# Patient Record
Sex: Male | Born: 1990 | Race: White | Hispanic: No | Marital: Married | Smoking: Current every day smoker
Health system: Southern US, Community
[De-identification: ages and names within clinical notes are randomized; demographics above are authoritative.]

## PROBLEM LIST (undated history)

## (undated) DIAGNOSIS — F419 Anxiety disorder, unspecified: Secondary | ICD-10-CM

## (undated) DIAGNOSIS — N200 Calculus of kidney: Secondary | ICD-10-CM

## (undated) DIAGNOSIS — N201 Calculus of ureter: Secondary | ICD-10-CM

## (undated) DIAGNOSIS — R001 Bradycardia, unspecified: Secondary | ICD-10-CM

## (undated) DIAGNOSIS — F32A Depression, unspecified: Secondary | ICD-10-CM

## (undated) DIAGNOSIS — F329 Major depressive disorder, single episode, unspecified: Secondary | ICD-10-CM

## (undated) HISTORY — DX: Anxiety disorder, unspecified: F41.9

## (undated) HISTORY — DX: Depression, unspecified: F32.A

## (undated) HISTORY — DX: Bradycardia, unspecified: R00.1

## (undated) HISTORY — DX: Major depressive disorder, single episode, unspecified: F32.9

## (undated) HISTORY — DX: Calculus of ureter: N20.1

## (undated) HISTORY — PX: NO PAST SURGERIES: SHX2092

---
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
## 2010-01-25 ENCOUNTER — Ambulatory Visit: Payer: Self-pay | Admitting: Internal Medicine

## 2010-08-02 ENCOUNTER — Ambulatory Visit: Payer: Self-pay | Admitting: Internal Medicine

## 2010-10-26 NOTE — Assessment & Plan Note (Signed)
Summary: nausea/diarrhea/cd   Vital Signs:  Patient profile:   20 year old male Height:      68 inches Weight:      256.25 pounds BMI:     39.10 O2 Sat:      97 % on Room air Temp:     98.4 degrees F oral Pulse rate:   60 / minute BP sitting:   100 / 68  (left arm) Cuff size:   large  Vitals Entered By: Zella Ball Ewing CMA Duncan Dull) (August 02, 2010 3:56 PM)  O2 Flow:  Room air CC: nasal congestion/RE   CC:  nasal congestion/RE.  History of Present Illness: here wtih acute c/o mild to mod 3 days onset facial pain, pressure, fever and greenish d/c, with marked cough, and today onset prod cough as well;  Pt denies CP, worsening sob, doe, wheezing, orthopnea, pnd, worsening LE edema, palps, dizziness or syncope  Pt denies new neuro symptoms such as headache, facial or extremity weakness  Ran out of flonase, which worked well for his nasal symptoms that have flared with the fall season.  No wheezing.  Denies polydipsia, polyuria. Denies worsening depressive symptoms, suicidal ideation, or panic.    Preventive Screening-Counseling & Management      Drug Use:  no.    Problems Prior to Update: 1)  Bronchitis-acute  (ICD-466.0) 2)  Conjunctivitis, Allergic  (ICD-372.14) 3)  Allergic Rhinitis  (ICD-477.9) 4)  Sinusitis- Acute-nos  (ICD-461.9) 5)  Preventive Health Care  (ICD-V70.0)  Medications Prior to Update: 1)  Cetirizine Hcl 10 Mg Tabs (Cetirizine Hcl) .Marland Kitchen.. 1 By Mouth Once Daily As Needed 2)  Fluticasone Propionate 50 Mcg/act Susp (Fluticasone Propionate) .... 2 Spray/side Once Daily As Needed  Current Medications (verified): 1)  Fluticasone Propionate 50 Mcg/act Susp (Fluticasone Propionate) .... 2 Spray/side Once Daily As Needed 2)  Clarithromycin 500 Mg Tabs (Clarithromycin) .Marland Kitchen.. 1po Two Times A Day 3)  Tessalon Perles 100 Mg Caps (Benzonatate) .Marland Kitchen.. 1-2 By Mouth Three Times A Day As Needed  Allergies (verified): No Known Drug Allergies  Past History:  Past Surgical  History: Last updated: 11/27/2008 Denies surgical history  Social History: Last updated: 08/02/2010 Single HS grad no children Never Smoked Alcohol use-no Drug use-no  Risk Factors: Smoking Status: never (11/27/2008)  Past Medical History: Allergic rhinitis  Social History: Single HS grad no children Never Smoked Alcohol use-no Drug use-no Drug Use:  no  Review of Systems       all otherwise negative per pt -    Physical Exam  General:  alert and overweight-appearing.  , mild ill  Head:  normocephalic and atraumatic.   Eyes:  vision grossly intact, pupils equal, and pupils round.   Ears:  bialt tm's mild erythema, canals clear, sinus tender bialt Nose:  nasal dischargemucosal pallor and mucosal edema.   Mouth:  pharyngeal erythema and fair dentition.   Neck:  supple and cervical lymphadenopathy.   Lungs:  normal respiratory effort and normal breath sounds.   Heart:  normal rate and regular rhythm.   Extremities:  no edema, no erythema    Impression & Recommendations:  Problem # 1:  SINUSITIS- ACUTE-NOS (ICD-461.9)  His updated medication list for this problem includes:    Fluticasone Propionate 50 Mcg/act Susp (Fluticasone propionate) .Marland Kitchen... 2 spray/side once daily as needed    Clarithromycin 500 Mg Tabs (Clarithromycin) .Marland Kitchen... 1po two times a day    Tessalon Perles 100 Mg Caps (Benzonatate) .Marland Kitchen... 1-2 by mouth three times  a day as needed treat as above, f/u any worsening signs or symptoms   Problem # 2:  BRONCHITIS-ACUTE (ICD-466.0)  His updated medication list for this problem includes:    Clarithromycin 500 Mg Tabs (Clarithromycin) .Marland Kitchen... 1po two times a day    Tessalon Perles 100 Mg Caps (Benzonatate) .Marland Kitchen... 1-2 by mouth three times a day as needed /treat as above, f/u any worsening signs or symptoms   Problem # 3:  ALLERGIC RHINITIS (ICD-477.9)  The following medications were removed from the medication list:    Cetirizine Hcl 10 Mg Tabs (Cetirizine  hcl) .Marland Kitchen... 1 by mouth once daily as needed His updated medication list for this problem includes:    Fluticasone Propionate 50 Mcg/act Susp (Fluticasone propionate) .Marland Kitchen... 2 spray/side once daily as needed treat as above, f/u any worsening signs or symptoms - for med refill today  Complete Medication List: 1)  Fluticasone Propionate 50 Mcg/act Susp (Fluticasone propionate) .... 2 spray/side once daily as needed 2)  Clarithromycin 500 Mg Tabs (Clarithromycin) .Marland Kitchen.. 1po two times a day 3)  Tessalon Perles 100 Mg Caps (Benzonatate) .Marland Kitchen.. 1-2 by mouth three times a day as needed  Patient Instructions: 1)  Please take all new medications as prescribed 2)  Continue all previous medications as before this visit 3)  You can also use Mucinex OTC or it's generic for congestion  4)  Please schedule a follow-up appointment as needed. Prescriptions: TESSALON PERLES 100 MG CAPS (BENZONATATE) 1-2 by mouth three times a day as needed  #60 x 1   Entered and Authorized by:   Corwin Levins MD   Signed by:   Corwin Levins MD on 08/02/2010   Method used:   Print then Give to Patient   RxID:   [id number] CLARITHROMYCIN 500 MG TABS (CLARITHROMYCIN) 1po two times a day  #20 x 0   Entered and Authorized by:   Corwin Levins MD   Signed by:   Corwin Levins MD on 08/02/2010   Method used:   Print then Give to Patient   RxID:   [id number] FLUTICASONE PROPIONATE 50 MCG/ACT SUSP (FLUTICASONE PROPIONATE) 2 spray/side once daily as needed  #1 x 11   Entered and Authorized by:   Corwin Levins MD   Signed by:   Corwin Levins MD on 08/02/2010   Method used:   Print then Give to Patient   RxID:   [id number]    Orders Added: 1)  Est. Patient Level IV [25366]

## 2010-10-26 NOTE — Assessment & Plan Note (Signed)
Summary: ALLERGIES--STC   Vital Signs:  Patient profile:   20 year old male Height:      68.5 inches Weight:      247.25 pounds BMI:     37.18 O2 Sat:      97 % on Room air Temp:     98.2 degrees F oral Pulse rate:   64 / minute BP sitting:   108 / 62  (left arm) Cuff size:   large  Vitals Entered ByZella Ball Ewing (Jan 25, 2010 3:55 PM)  O2 Flow:  Room air CC: Allergies, congestion, watery eyes/RE   CC:  Allergies, congestion, and watery eyes/RE.  History of Present Illness: here with 2 to 3 wks marked allergic symtpoms, eye and nasal/sinus congestion with itch and sneezing, but no headache, fever, ear symptoms, ST, tongue swelling, and Pt denies CP, sob, doe, wheezing, orthopnea, pnd, worsening LE edema, palps, dizziness or syncope Seems to be similar as symtpoms a year ago when he received a depomedrol shot and seemd to do quite nicely afterward with apparetnly seasonal symtpoms.  This year has not improved at all with the zyrtec otc and has significant congestion.  Problems Prior to Update: 1)  Conjunctivitis, Allergic  (ICD-372.14) 2)  Allergic Rhinitis  (ICD-477.9) 3)  Sinusitis- Acute-nos  (ICD-461.9) 4)  Preventive Health Care  (ICD-V70.0)  Medications Prior to Update: 1)  Cetirizine Hcl 10 Mg Tabs (Cetirizine Hcl) .Marland Kitchen.. 1 By Mouth Once Daily As Needed 2)  Fluticasone Propionate 50 Mcg/act Susp (Fluticasone Propionate) .... 2 Spray/side Once Daily As Needed  Current Medications (verified): 1)  Cetirizine Hcl 10 Mg Tabs (Cetirizine Hcl) .Marland Kitchen.. 1 By Mouth Once Daily As Needed 2)  Fluticasone Propionate 50 Mcg/act Susp (Fluticasone Propionate) .... 2 Spray/side Once Daily As Needed  Allergies (verified): No Known Drug Allergies  Past History:  Past Medical History: Last updated: 01/29/2009 Unremarkable Allergic rhinitis  Past Surgical History: Last updated: 11/27/2008 Denies surgical history  Social History: Last updated: 01/25/2010 Single HS grad no  children Never Smoked Alcohol use-no  Risk Factors: Smoking Status: never (11/27/2008)  Social History: Reviewed history from 11/27/2008 and no changes required. Single HS grad no children Never Smoked Alcohol use-no  Review of Systems       all otherwise negative per pt -    Physical Exam  General:  alert and overweight-appearing.   Head:  normocephalic and atraumatic.   Eyes:  vision grossly intact, pupils equal, and pupils round.   Ears:  bilat conjunctival mild erythema and clear d/c Nose:  nasal dischargemucosal pallor and mucosal edema.   Mouth:  pharyngeal erythema and fair dentition.   Neck:  supple and no masses.   Lungs:  normal respiratory effort and normal breath sounds.   Heart:  normal rate and regular rhythm.   Extremities:  no edema, no erythema    Impression & Recommendations:  Problem # 1:  ALLERGIC RHINITIS (ICD-477.9)  His updated medication list for this problem includes:    Cetirizine Hcl 10 Mg Tabs (Cetirizine hcl) .Marland Kitchen... 1 by mouth once daily as needed    Fluticasone Propionate 50 Mcg/act Susp (Fluticasone propionate) .Marland Kitchen... 2 spray/side once daily as needed  Orders: Depo- Medrol 40mg  (J1030) Depo- Medrol 80mg  (J1040) Admin of Therapeutic Inj  intramuscular or subcutaneous (04540) treat as above, f/u any worsening signs or symptoms.  for depo shot as above, Continue all previous medications as before this visit , and add the flonase nasal   Problem # 2:  CONJUNCTIVITIS, ALLERGIC (ICD-372.14) treat as above, f/u any worsening signs or symptoms , also for OTC zaditor as needed   Complete Medication List: 1)  Cetirizine Hcl 10 Mg Tabs (Cetirizine hcl) .Marland Kitchen.. 1 by mouth once daily as needed 2)  Fluticasone Propionate 50 Mcg/act Susp (Fluticasone propionate) .... 2 spray/side once daily as needed  Patient Instructions: 1)  you had the steroid shot today 2)  Continue all previous medications as before this visit - the zyrtec 3)  you should also  take the naxonex nasal spray at 2 spray/side once daily until the sample runs out 4)  if you need to, you are also given the generic flonase prescription nasal spray to use after that if needed 5)  You can also use the OTC Zaditor for the eyes as needed  6)  Please schedule a follow-up appointment as needed. Prescriptions: CETIRIZINE HCL 10 MG TABS (CETIRIZINE HCL) 1 by mouth once daily as needed  #30 x 11   Entered and Authorized by:   Corwin Levins MD   Signed by:   Corwin Levins MD on 01/25/2010   Method used:   Print then Give to Patient   RxID:   [id number] FLUTICASONE PROPIONATE 50 MCG/ACT SUSP (FLUTICASONE PROPIONATE) 2 spray/side once daily as needed  #1 x 11   Entered and Authorized by:   Corwin Levins MD   Signed by:   Corwin Levins MD on 01/25/2010   Method used:   Print then Give to Patient   RxID:   [id number]    Medication Administration  Injection # 1:    Medication: Depo- Medrol 40mg     Diagnosis: ALLERGIC RHINITIS (ICD-477.9)    Route: IM    Site: RUOQ gluteus    Exp Date: 07/2012    Lot #: [id number]    Mfr: Pharmacia    Given by: Zella Ball Ewing (Jan 25, 2010 4:11 PM)  Injection # 2:    Medication: Depo- Medrol 80mg     Diagnosis: ALLERGIC RHINITIS (ICD-477.9)    Route: IM    Site: RUOQ gluteus    Exp Date: 07/2012    Lot #: [id number]    Mfr: Pharmacia    Given by: Zella Ball Ewing (Jan 25, 2010 4:11 PM)  Orders Added: 1)  Depo- Medrol 40mg  [J1030] 2)  Depo- Medrol 80mg  [J1040] 3)  Admin of Therapeutic Inj  intramuscular or subcutaneous [96372] 4)  Est. Patient Level III [41324]

## 2010-12-20 ENCOUNTER — Ambulatory Visit (INDEPENDENT_AMBULATORY_CARE_PROVIDER_SITE_OTHER): Payer: PRIVATE HEALTH INSURANCE | Admitting: Internal Medicine

## 2010-12-20 ENCOUNTER — Encounter: Payer: Self-pay | Admitting: Internal Medicine

## 2010-12-20 DIAGNOSIS — H1045 Other chronic allergic conjunctivitis: Secondary | ICD-10-CM

## 2010-12-20 DIAGNOSIS — J309 Allergic rhinitis, unspecified: Secondary | ICD-10-CM

## 2010-12-20 DIAGNOSIS — F17200 Nicotine dependence, unspecified, uncomplicated: Secondary | ICD-10-CM

## 2010-12-20 MED ORDER — FLUTICASONE PROPIONATE 50 MCG/ACT NA SUSP: 2.0000 | Freq: Every day | NASAL | Status: DC

## 2010-12-20 MED ORDER — CETIRIZINE HCL 10 MG PO TABS: 10.0000 mg | ORAL_TABLET | Freq: Every day | ORAL | Status: DC

## 2010-12-20 MED ORDER — METHYLPREDNISOLONE ACETATE 80 MG/ML IJ SUSP
120.0000 mg | Freq: Once | INTRAMUSCULAR | Status: AC
  Administered 2010-12-20: 120 mg via INTRAMUSCULAR

## 2010-12-20 NOTE — Assessment & Plan Note (Signed)
D/w pt - would be good candidate for quitting smoking with chantix but declines rx now but may call back later due to cost

## 2010-12-20 NOTE — Assessment & Plan Note (Signed)
treatment as below, f/u any worsening symptoms , also for zaditor otc prn

## 2010-12-20 NOTE — Progress Notes (Signed)
  Subjective:    Patient ID: Blake Walsh, male    DOB: [birth year], 20 y.o.   MRN: [id number]  HPI  Here with c/o worse than usual nasal allergy symtpoms, somwhat better with the OTC cetriizine, also with itchy eyes and watery eyes, sneezing and congestion.  No HA, fever, ST but has some cough (mosly nonprod) and Pt denies chest pain, increased sob or doe, wheezing, orthopnea, PND, increased LE swelling, palpitations, dizziness or syncope.    .Pt denies new neurological symptoms such as new headache, or facial or extremity weakness or numbness.    Pt denies polydipsia, polyuria  Pt states overall good compliance with meds,  Wants to quit smoking,  But not sure about options.    Past Medical History  Diagnosis Date  . Allergic rhinitis   . ALLERGIC RHINITIS 01/29/2009  . CONJUNCTIVITIS, ALLERGIC 01/29/2009   History reviewed. No pertinent past surgical history.  reports that he has been smoking.  He does not have any smokeless tobacco history on file. He reports that he does not drink alcohol or use illicit drugs. family history includes Hyperlipidemia in his father. No Known Allergies  Current Outpatient Prescriptions on File Prior to Visit  Medication Sig Dispense Refill  . benzonatate (TESSALON) 100 MG capsule Take 100 mg by mouth 3 (three) times daily as needed.        . clarithromycin (BIAXIN) 500 MG tablet Take 500 mg by mouth 2 (two) times daily.            Review of Systems Review of Systems  Constitutional: Negative for diaphoresis and unexpected weight change.  HENT: Negative for drooling and tinnitus.   Eyes: Negative for photophobia and visual disturbance.  Respiratory: Negative for choking and stridor.   Gastrointestinal: Negative for vomiting and blood in stool.  Genitourinary: Negative for hematuria and decreased urine volume.  Musculoskeletal: Negative for gait problem.  Skin: Negative for color change and wound.  Neurological: Negative for tremors and numbness.    Psychiatric/Behavioral: Negative for decreased concentration. The patient is not hyperactive.       Objective:   Physical Exam Physical Exam  Constitutional: Pt appears well-developed and well-nourished.  HENT: Head: Normocephalic.  Right Ear: External ear normal.  Left Ear: External ear normal, but TM with mild erythema,  Sinus  Nontender, O-P with cobblestoning Eyes: Conjunctivae and EOM are normal. Pupils are equal, round, and reactive to light.  Neck: Normal range of motion. Neck supple.  Cardiovascular: Normal rate and regular rhythm.   Pulmonary/Chest: Effort normal and breath sounds normal.  Abd:  Soft, NT, non-distended, + BS Neurological: Pt is alert. No cranial nerve deficit.  Skin: Skin is warm. No erythema.  Psychiatric: Pt behavior is normal. Thought content normal.         Assessment & Plan:

## 2010-12-20 NOTE — Assessment & Plan Note (Signed)
Uncontrolled, mod to severe, for depomedrol IM today, and zyrtec/flonase for home,f/u any worsening symtpom,  Consider allergy referral

## 2010-12-20 NOTE — Patient Instructions (Addendum)
You had the steroid shot today Take all new medications as prescribed - the zyrtec and flonase Continue all other medications as before For the eyes, you should also consider OTC Zaditor (zyrtec in eye drops) Please call if you want to take the chantix to help quitting smoking Please followup in Jan 2013 for CPX with labs

## 2010-12-22 ENCOUNTER — Encounter: Payer: Self-pay | Admitting: Internal Medicine

## 2010-12-30 ENCOUNTER — Encounter: Payer: Self-pay | Admitting: Internal Medicine

## 2010-12-31 ENCOUNTER — Encounter (INDEPENDENT_AMBULATORY_CARE_PROVIDER_SITE_OTHER): Payer: PRIVATE HEALTH INSURANCE | Admitting: Family Medicine

## 2011-01-01 NOTE — Progress Notes (Signed)
Patient left without being seen.

## 2011-01-13 NOTE — Progress Notes (Signed)
This encounter was created in error - please disregard.

## 2011-07-10 ENCOUNTER — Encounter: Payer: Self-pay | Admitting: Internal Medicine

## 2011-07-10 DIAGNOSIS — Z0001 Encounter for general adult medical examination with abnormal findings: Secondary | ICD-10-CM | POA: Insufficient documentation

## 2011-07-11 ENCOUNTER — Encounter: Payer: Self-pay | Admitting: Internal Medicine

## 2011-07-11 ENCOUNTER — Ambulatory Visit (INDEPENDENT_AMBULATORY_CARE_PROVIDER_SITE_OTHER): Payer: PRIVATE HEALTH INSURANCE | Admitting: Internal Medicine

## 2011-07-11 VITALS — BP 110/60 | HR 54 | Temp 97.4°F | Ht 69.0 in | Wt 241.0 lb

## 2011-07-11 DIAGNOSIS — J209 Acute bronchitis, unspecified: Secondary | ICD-10-CM

## 2011-07-11 DIAGNOSIS — M25512 Pain in left shoulder: Secondary | ICD-10-CM

## 2011-07-11 DIAGNOSIS — M25519 Pain in unspecified shoulder: Secondary | ICD-10-CM

## 2011-07-11 DIAGNOSIS — J309 Allergic rhinitis, unspecified: Secondary | ICD-10-CM

## 2011-07-11 MED ORDER — AZITHROMYCIN 250 MG PO TABS: ORAL_TABLET | ORAL | Status: AC

## 2011-07-11 MED ORDER — PREDNISONE 10 MG PO TABS: 10.0000 mg | ORAL_TABLET | Freq: Every day | ORAL | Status: AC

## 2011-07-11 MED ORDER — HYDROCODONE-HOMATROPINE 5-1.5 MG/5ML PO SYRP: 5.0000 mL | ORAL_SOLUTION | Freq: Four times a day (QID) | ORAL | Status: AC | PRN

## 2011-07-11 NOTE — Patient Instructions (Addendum)
Take all new medications as prescribed Continue all other medications as before Please call in one 1 wk if the left shoudler is not improved for orthopedic referral

## 2011-07-11 NOTE — Assessment & Plan Note (Signed)
stable overall by hx and exam, most recent data reviewed with pt, and pt to continue medical treatment as before  SpO2 Readings from Last 3 Encounters:  07/11/11 96%  12/20/10 97%  08/02/10 97%

## 2011-07-11 NOTE — Progress Notes (Signed)
  Subjective:    Patient ID: Blake Walsh, male    DOB: Jul 08, 1991, 20 y.o.   MRN: [id number]  HPI Here with acute onset mild to mod 2-3 days ST, HA, general weakness and malaise, with prod cough greenish sputum, but Pt denies chest pain, increased sob or doe, wheezing, orthopnea, PND, increased LE swelling, palpitations, dizziness or syncope.  Also has known mold in the old apartment and not sure if played a part but has only mild prior nasal allergy symtpoms no change in severity, and has done ok with OTC allegra or similar.  Also today with left shoulder pain after remodeling a structure recently, left handed, and often sleeping on a cough on his left side, now moderate, constant, worse to lie on it or lift the arm to shoulder height;  Nothing makes better except resting it;  No prior hx of shoulder problem or having seen ortho, which he wants to avoid now due to cost. Past Medical History  Diagnosis Date  . Allergic rhinitis   . ALLERGIC RHINITIS 01/29/2009  . CONJUNCTIVITIS, ALLERGIC 01/29/2009   No past surgical history on file.  reports that he has been smoking.  He does not have any smokeless tobacco history on file. He reports that he does not drink alcohol or use illicit drugs. family history includes Hyperlipidemia in his father. No Known Allergies Current Outpatient Prescriptions on File Prior to Visit  Medication Sig Dispense Refill  . cetirizine (ZYRTEC) 10 MG tablet Take 1 tablet (10 mg total) by mouth daily.  30 tablet  11  . fluticasone (FLONASE) 50 MCG/ACT nasal spray 2 sprays by Nasal route daily.  16 g  2  . benzonatate (TESSALON) 100 MG capsule Take 100 mg by mouth 3 (three) times daily as needed.        . clarithromycin (BIAXIN) 500 MG tablet Take 500 mg by mouth 2 (two) times daily.          Review of Systems Review of Systems  Constitutional: Negative for diaphoresis and unexpected weight change.  HENT: Negative for drooling and tinnitus.   Eyes: Negative for photophobia and  visual disturbance.  Respiratory: Negative for choking and stridor.   Gastrointestinal: Negative for vomiting and blood in stool.  Genitourinary: Negative for hematuria and decreased urine volume.     Objective:   Physical Exam BP 110/60  Pulse 54  Temp(Src) 97.4 F (36.3 C) (Oral)  Ht 5\' 9"  (1.753 m)  Wt 241 lb 0.6 oz (109.335 kg)  BMI 35.60 kg/m2  SpO2 96% Physical Exam  VS noted, mild ill Constitutional: Pt appears well-developed and well-nourished.  HENT: Head: Normocephalic.  Right Ear: External ear normal.  Left Ear: External ear normal.  Bilat tm's mild erythema.  Sinus nontender.  Pharynx mild erythema Eyes: Conjunctivae and EOM are normal. Pupils are equal, round, and reactive to light.  Neck: Normal range of motion. Neck supple.  Cardiovascular: Normal rate and regular rhythm.   Pulmonary/Chest: Effort normal and breath sounds normal. - no wheeze or rales  Neurological: Pt is alert. No cranial nerve deficit. motor/sens/dtr intact to UE's Skin: Skin is warm. No erythema.  Psychiatric: Pt behavior is normal. Thought content normal.  MSK:  Left shoudler with tender rot cuff area and subacromial bursa;  Unable to forw elev or abduct > 90 degrees without signficant pain    Assessment & Plan:

## 2011-07-11 NOTE — Assessment & Plan Note (Signed)
C/w left rot cuff tedenoitits/bursitis - for predpack,tylenol prn, but to ortho if not improved in 1 wk

## 2011-07-11 NOTE — Assessment & Plan Note (Signed)
Mild to mod, for antibx course,  to f/u any worsening symptoms or concerns 

## 2011-08-31 ENCOUNTER — Ambulatory Visit (INDEPENDENT_AMBULATORY_CARE_PROVIDER_SITE_OTHER): Payer: PRIVATE HEALTH INSURANCE | Admitting: Endocrinology

## 2011-08-31 ENCOUNTER — Telehealth: Payer: Self-pay | Admitting: *Deleted

## 2011-08-31 ENCOUNTER — Encounter: Payer: Self-pay | Admitting: Endocrinology

## 2011-08-31 VITALS — BP 108/62 | HR 55 | Temp 98.3°F | Ht 68.0 in | Wt 247.4 lb

## 2011-08-31 DIAGNOSIS — J069 Acute upper respiratory infection, unspecified: Secondary | ICD-10-CM

## 2011-08-31 MED ORDER — PROMETHAZINE-CODEINE 6.25-10 MG/5ML PO SYRP: 5.0000 mL | ORAL_SOLUTION | ORAL | Status: AC | PRN

## 2011-08-31 MED ORDER — AZITHROMYCIN 500 MG PO TABS: 500.0000 mg | ORAL_TABLET | Freq: Every day | ORAL | Status: AC

## 2011-08-31 NOTE — Telephone Encounter (Signed)
[facility] on [address] called, they want to know if they should hold Phenergan with Codeine rx since pt picked up refill of Hycodan cough syrup (120 mL) yesterday-please advise.

## 2011-08-31 NOTE — Telephone Encounter (Signed)
Tom Pharmacist at [facility] informed of MD's advisement.

## 2011-08-31 NOTE — Patient Instructions (Addendum)
Here are 2 prescriptions: 1 for congestion, and 1 for an antibiotic.   Loratadine-d (non-prescription) will help your congestion. I hope you feel better soon.  If you don't feel better by next week, please call dr Jonny Ruiz.

## 2011-08-31 NOTE — Progress Notes (Signed)
  Subjective:    Patient ID: Blake Walsh, male    DOB: 10-Aug-1991, 20 y.o.   MRN: [id number]  HPI Pt states few days of moderate dry cough in the chest, and assoc myalgias.  Denies sob, and wheezing.  He has nasal congestion, and fullness of the left ear Past Medical History  Diagnosis Date  . Allergic rhinitis   . ALLERGIC RHINITIS 01/29/2009  . CONJUNCTIVITIS, ALLERGIC 01/29/2009    No past surgical history on file.  History   Social History  . Marital Status: Married    Spouse Name: N/A    Number of Children: N/A  . Years of Education: 12   Occupational History  . Not on file.   Social History Main Topics  . Smoking status: Current Everyday Smoker  . Smokeless tobacco: Not on file  . Alcohol Use: No  . Drug Use: No  . Sexually Active: Not on file   Other Topics Concern  . Not on file   Social History Narrative  . No narrative on file    Current Outpatient Prescriptions on File Prior to Visit  Medication Sig Dispense Refill  . cetirizine (ZYRTEC) 10 MG tablet Take 1 tablet (10 mg total) by mouth daily.  30 tablet  11  . fluticasone (FLONASE) 50 MCG/ACT nasal spray 2 sprays by Nasal route daily.  16 g  2    No Known Allergies  Family History  Problem Relation Age of Onset  . Hyperlipidemia Father     BP 108/62  Pulse 55  Temp(Src) 98.3 F (36.8 C) (Oral)  Ht 5\' 8"  (1.727 m)  Wt 247 lb 6.4 oz (112.22 kg)  BMI 37.62 kg/m2  SpO2 96%    Review of Systems Fever is improved    Objective:   Physical Exam VITAL SIGNS:  See vs page GENERAL: no distress head: no deformity eyes: no periorbital swelling, no proptosis external nose and ears are normal mouth: no lesion seen Right tm is red, but the left is normal NECK: There is no palpable thyroid enlargement.  No thyroid nodule is palpable.  No palpable lymphadenopathy at the anterior neck. LUNGS:  Clear to auscultation       Assessment & Plan:  Blake Walsh, new

## 2011-08-31 NOTE — Telephone Encounter (Signed)
Yes, please d/c any remaining refills

## 2012-01-16 ENCOUNTER — Ambulatory Visit: Payer: PRIVATE HEALTH INSURANCE | Admitting: Internal Medicine

## 2012-01-18 ENCOUNTER — Ambulatory Visit (INDEPENDENT_AMBULATORY_CARE_PROVIDER_SITE_OTHER): Payer: PRIVATE HEALTH INSURANCE | Admitting: Internal Medicine

## 2012-01-18 ENCOUNTER — Encounter: Payer: Self-pay | Admitting: Internal Medicine

## 2012-01-18 VITALS — BP 108/62 | HR 85 | Temp 98.4°F | Ht 68.0 in | Wt 251.8 lb

## 2012-01-18 DIAGNOSIS — J309 Allergic rhinitis, unspecified: Secondary | ICD-10-CM

## 2012-01-18 MED ORDER — FLUTICASONE PROPIONATE 50 MCG/ACT NA SUSP: 2.0000 | Freq: Every day | NASAL | Status: DC

## 2012-01-18 MED ORDER — METHYLPREDNISOLONE ACETATE 80 MG/ML IJ SUSP
120.0000 mg | Freq: Once | INTRAMUSCULAR | Status: AC
  Administered 2012-01-18: 120 mg via INTRAMUSCULAR

## 2012-01-18 MED ORDER — PREDNISONE 10 MG PO TABS: 10.0000 mg | ORAL_TABLET | Freq: Every day | ORAL | Status: DC

## 2012-01-18 MED ORDER — CETIRIZINE HCL 10 MG PO TABS: 10.0000 mg | ORAL_TABLET | Freq: Every day | ORAL | Status: DC

## 2012-01-18 NOTE — Progress Notes (Signed)
  Subjective:    Patient ID: Blake Walsh, male    DOB: 09/13/1991, 21 y.o.   MRN: [id number]  HPI  Here to f/u - Does have 10 days ongoing nasal allergy symptoms with clear congestion, itch and sneeze, without fever, pain, ST, cough or wheezing, out of allergy meds.  No fever, ST and Pt denies chest pain, increased sob or doe, wheezing, orthopnea, PND, increased LE swelling, palpitations, dizziness or syncope.   Pt denies polydipsia, polyuria. Past Medical History  Diagnosis Date  . Allergic rhinitis   . ALLERGIC RHINITIS 01/29/2009  . CONJUNCTIVITIS, ALLERGIC 01/29/2009   No past surgical history on file.  reports that he has been smoking.  He does not have any smokeless tobacco history on file. He reports that he does not drink alcohol or use illicit drugs. family history includes Hyperlipidemia in his father. No Known Allergies Current Outpatient Prescriptions on File Prior to Visit  Medication Sig Dispense Refill  . DISCONTD: cetirizine (ZYRTEC) 10 MG tablet Take 1 tablet (10 mg total) by mouth daily.  30 tablet  11  . DISCONTD: fluticasone (FLONASE) 50 MCG/ACT nasal spray 2 sprays by Nasal route daily.  16 g  2   No current facility-administered medications on file prior to visit.   Review of Systems All otherwise neg per pt     Objective:   Physical Exam BP 108/62  Pulse 85  Temp(Src) 98.4 F (36.9 C) (Oral)  Ht 5\' 8"  (1.727 m)  Wt 251 lb 12.8 oz (114.216 kg)  BMI 38.29 kg/m2  SpO2 97% Physical Exam  VS noted Constitutional: Pt appears well-developed and well-nourished.  HENT: Head: Normocephalic.  Right Ear: External ear normal.  Left Ear: External ear normal. Bilat tm's mild erythema.  Sinus nontender.  Pharynx mild erythema  Eyes: Conjunctivae and EOM are normal. Pupils are equal, round, and reactive to light.  Neck: Normal range of motion. Neck supple.  Cardiovascular: Normal rate and regular rhythm.   Pulmonary/Chest: Effort normal and breath sounds normal.    Neurological: Pt is alert.  Skin: Skin is warm. No erythema. no rash Psychiatric: Pt behavior is normal. Thought content normal.     Assessment & Plan:

## 2012-01-18 NOTE — Patient Instructions (Signed)
You had the steroid shot today Take all new medications as prescribed - the prednisone Continue all other medications as before - the other meds are refilled today

## 2012-01-18 NOTE — Assessment & Plan Note (Signed)
Moderate flare, for depomedrol IM, predpack, and re-start allegra/flonase asd,  to f/u any worsening symptoms or concerns

## 2012-02-21 ENCOUNTER — Telehealth: Payer: Self-pay

## 2012-02-21 NOTE — Telephone Encounter (Signed)
We dont normally prescribe narcotic containing cough med for this;  Better would be to try allegra 180 mg otc prn, as well as delsym otc prn  Then consider OV if not improved

## 2012-02-21 NOTE — Telephone Encounter (Signed)
Pt's mother called stating pt has developed a nagging cough, worse at night, that started because of allergies. Mother is requesting a cough syrup for this pt, please advise.

## 2012-02-22 NOTE — Telephone Encounter (Signed)
Called left message to call back 

## 2012-02-22 NOTE — Telephone Encounter (Signed)
Patient informed. 

## 2012-04-26 ENCOUNTER — Ambulatory Visit (INDEPENDENT_AMBULATORY_CARE_PROVIDER_SITE_OTHER): Payer: PRIVATE HEALTH INSURANCE | Admitting: Endocrinology

## 2012-04-26 ENCOUNTER — Encounter: Payer: Self-pay | Admitting: Endocrinology

## 2012-04-26 VITALS — BP 110/68 | HR 51 | Temp 97.6°F

## 2012-04-26 DIAGNOSIS — J069 Acute upper respiratory infection, unspecified: Secondary | ICD-10-CM

## 2012-04-26 MED ORDER — CEFUROXIME AXETIL 250 MG PO TABS: 250.0000 mg | ORAL_TABLET | Freq: Two times a day (BID) | ORAL | Status: AC

## 2012-04-26 MED ORDER — PROMETHAZINE-CODEINE 6.25-10 MG/5ML PO SYRP: 5.0000 mL | ORAL_SOLUTION | ORAL | Status: AC | PRN

## 2012-04-26 NOTE — Patient Instructions (Addendum)
Here are 2 prescriptions: cough medicine and antibiotic. Loratadine-d (non-prescription) will help your congestion. I hope you feel better soon.  If you don't feel better by next week, please call back.

## 2012-04-26 NOTE — Progress Notes (Signed)
  Subjective:    Patient ID: Blake Walsh, male    DOB: 11-Jul-1991, 21 y.o.   MRN: [id number]  HPI Pt states 1 day of slight prod-quality cough in the chest, and assoc myalgias Past Medical History  Diagnosis Date  . Allergic rhinitis   . ALLERGIC RHINITIS 01/29/2009  . CONJUNCTIVITIS, ALLERGIC 01/29/2009    No past surgical history on file.  History   Social History  . Marital Status: Married    Spouse Name: N/A    Number of Children: N/A  . Years of Education: 12   Occupational History  . Not on file.   Social History Main Topics  . Smoking status: Current Everyday Smoker  . Smokeless tobacco: Not on file  . Alcohol Use: No  . Drug Use: No  . Sexually Active: Not on file   Other Topics Concern  . Not on file   Social History Narrative  . No narrative on file    Current Outpatient Prescriptions on File Prior to Visit  Medication Sig Dispense Refill  . cetirizine (ZYRTEC) 10 MG tablet Take 1 tablet (10 mg total) by mouth daily.  90 tablet  3  . fluticasone (FLONASE) 50 MCG/ACT nasal spray Place 2 sprays into the nose daily.  16 g  5    No Known Allergies  Family History  Problem Relation Age of Onset  . Hyperlipidemia Father     BP 110/68  Pulse 51  Temp 97.6 F (36.4 C) (Oral)  SpO2 97%    Review of Systems He has chills, nasal congestion, and sore throat.      Objective:   Physical Exam VITAL SIGNS:  See vs page GENERAL: no distress head: no deformity eyes: no periorbital swelling, no proptosis external nose and ears are normal mouth: no lesion seen Both tm's are red (R>L) NECK: There is no palpable thyroid enlargement.  No thyroid nodule is palpable.  No palpable lymphadenopathy at the anterior neck. LUNGS:  Clear to auscultation       Assessment & Plan:  URI.  new

## 2012-05-08 ENCOUNTER — Encounter: Payer: Self-pay | Admitting: Internal Medicine

## 2012-05-08 ENCOUNTER — Ambulatory Visit (INDEPENDENT_AMBULATORY_CARE_PROVIDER_SITE_OTHER): Payer: PRIVATE HEALTH INSURANCE | Admitting: Internal Medicine

## 2012-05-08 VITALS — BP 118/74 | HR 78 | Temp 98.4°F

## 2012-05-08 DIAGNOSIS — J309 Allergic rhinitis, unspecified: Secondary | ICD-10-CM

## 2012-05-08 DIAGNOSIS — H6690 Otitis media, unspecified, unspecified ear: Secondary | ICD-10-CM | POA: Insufficient documentation

## 2012-05-08 DIAGNOSIS — F17200 Nicotine dependence, unspecified, uncomplicated: Secondary | ICD-10-CM

## 2012-05-08 MED ORDER — AMOXICILLIN-POT CLAVULANATE 875-125 MG PO TABS: 1.0000 | ORAL_TABLET | Freq: Two times a day (BID) | ORAL | Status: AC

## 2012-05-08 MED ORDER — METHYLPREDNISOLONE ACETATE 80 MG/ML IJ SUSP
120.0000 mg | Freq: Once | INTRAMUSCULAR | Status: AC
  Administered 2012-05-08: 120 mg via INTRAMUSCULAR

## 2012-05-08 MED ORDER — PSEUDOEPHEDRINE HCL ER 120 MG PO TB12: 120.0000 mg | ORAL_TABLET | Freq: Two times a day (BID) | ORAL | Status: DC | PRN

## 2012-05-08 NOTE — Assessment & Plan Note (Signed)
Discussed.

## 2012-05-08 NOTE — Progress Notes (Signed)
Patient ID: Blake Walsh, male   DOB: 11-17-1990, 21 y.o.   MRN: [id number]  Subjective:    Patient ID: Blake Miyamoto Tirado, male    DOB: Sep 08, 1991, 21 y.o.   MRN: [id number]  Sore Throat  This is a new problem. The current episode started 1 to 4 weeks ago. Neither side of throat is experiencing more pain than the other. The pain is moderate.    Past Medical History  Diagnosis Date  . Allergic rhinitis   . ALLERGIC RHINITIS 01/29/2009  . CONJUNCTIVITIS, ALLERGIC 01/29/2009    No past surgical history on file.  History   Social History  . Marital Status: Married    Spouse Name: N/A    Number of Children: N/A  . Years of Education: 12   Occupational History  . Not on file.   Social History Main Topics  . Smoking status: Current Everyday Smoker  . Smokeless tobacco: Not on file  . Alcohol Use: No  . Drug Use: No  . Sexually Active: Not on file   Other Topics Concern  . Not on file   Social History Narrative  . No narrative on file    Current Outpatient Prescriptions on File Prior to Visit  Medication Sig Dispense Refill  . cetirizine (ZYRTEC) 10 MG tablet Take 1 tablet (10 mg total) by mouth daily.  90 tablet  3  . fluticasone (FLONASE) 50 MCG/ACT nasal spray Place 2 sprays into the nose daily.  16 g  5   No current facility-administered medications on file prior to visit.    No Known Allergies  Family History  Problem Relation Age of Onset  . Hyperlipidemia Father     There were no vitals taken for this visit.    Review of Systems He has chills, nasal congestion, and sore throat.      Objective:   Physical Exam VITAL SIGNS:  See vs page GENERAL: no distress head: no deformity eyes: no periorbital swelling, no proptosis external nose and ears are normal mouth: no lesion seen Both tm's are red (R>L) NECK: There is no palpable thyroid enlargement.  No thyroid nodule is palpable.  No palpable lymphadenopathy at the anterior neck. LUNGS:  Clear to auscultation       Assessment & Plan:  URI.  new

## 2012-05-08 NOTE — Assessment & Plan Note (Signed)
Depomedr 120 mg Sudafed bid

## 2012-05-08 NOTE — Assessment & Plan Note (Signed)
8/13 R>L refractory Augmentin bid

## 2012-05-10 ENCOUNTER — Encounter: Payer: Self-pay | Admitting: Internal Medicine

## 2012-11-05 ENCOUNTER — Encounter: Payer: Self-pay | Admitting: Internal Medicine

## 2012-11-05 ENCOUNTER — Ambulatory Visit (INDEPENDENT_AMBULATORY_CARE_PROVIDER_SITE_OTHER): Payer: PRIVATE HEALTH INSURANCE | Admitting: Internal Medicine

## 2012-11-05 VITALS — BP 102/72 | HR 50 | Temp 97.3°F | Ht 68.0 in | Wt 253.5 lb

## 2012-11-05 DIAGNOSIS — M546 Pain in thoracic spine: Secondary | ICD-10-CM

## 2012-11-05 MED ORDER — CYCLOBENZAPRINE HCL 5 MG PO TABS: 5.0000 mg | ORAL_TABLET | Freq: Three times a day (TID) | ORAL | Status: DC | PRN

## 2012-11-05 MED ORDER — NAPROXEN 500 MG PO TABS: 500.0000 mg | ORAL_TABLET | Freq: Two times a day (BID) | ORAL | Status: DC

## 2012-11-05 NOTE — Patient Instructions (Addendum)
Please take all new medication as prescribed Please continue all other medications as before, and refills have been done if requested. Thank you for enrolling in MyChart. Please follow the instructions below to securely access your online medical record. MyChart allows you to send messages to your doctor, view your test results, renew your prescriptions, schedule appointments, and more. To Log into My Chart online, please go by [employer] or [employer] to [email or web address], or download the MyChart App from the [employer] .  Your Username is: [email or web address] (pass [id number]) Please send a practice Message on Mychart later today.

## 2012-11-05 NOTE — Progress Notes (Signed)
Subjective:     Patient ID: Blake Walsh, male   DOB: 17-May-1991, 22 y.o.   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  Located lower back somewhat to the left, radiates to the upper back and shoulders, no radicular pain and, no bowel or bladder change, fever, wt loss,  worsening LE pain/numbness/weakness, gait change or falls. No HA, shoulder pain or other extremity pains. Pt denies chest pain, increased sob or doe, wheezing, orthopnea, PND, increased LE swelling, palpitations, dizziness or syncope.   Pt denies polydipsia, polyuria.  Has been excercising more at the GYM lately. Past Medical History  Diagnosis Date  . Allergic rhinitis   . ALLERGIC RHINITIS 01/29/2009  . CONJUNCTIVITIS, ALLERGIC 01/29/2009   No past surgical history on file.  reports that he has been smoking.  He does not have any smokeless tobacco history on file. He reports that he does not drink alcohol or use illicit drugs. family history includes Hyperlipidemia in his father. No Known Allergies No current outpatient prescriptions on file prior to visit.   No current facility-administered medications on file prior to visit.   Review of Systems All otherwise neg per pt    Objective:   Physical Exam BP 102/72  Pulse 50  Temp(Src) 97.3 F (36.3 C) (Oral)  Ht 5\' 8"  (1.727 m)  Wt 253 lb 8 oz (114.987 kg)  BMI 38.55 kg/m2  SpO2 96% VS noted,  Constitutional: Pt appears well-developed and well-nourished.  HENT: Head: NCAT.  Right Ear: External ear normal.  Left Ear: External ear normal.  Eyes: Conjunctivae and EOM are normal. Pupils are equal, round, and reactive to light.  Neck: Normal range of motion. Neck supple. Nontender Cardiovascular: Normal rate and regular  rhythm.   Pulmonary/Chest: Effort normal and breath sounds normal.  Abd:  Soft, NT, non-distended, + BS Neurological: Pt is alert. Not confused, motor/dtr/gait intact  Skin: Skin is warm. No erythema. No rash Psychiatric: Pt behavior is normal. Thought content normal.  Spine:  nontender except for bilat periscapular/parathoracic and bilat diffuse trapezoid area tender    Assessment:       Plan:

## 2012-11-05 NOTE — Assessment & Plan Note (Signed)
Has some lumbar, but pain primarily to the bilat parathoracic spine area with tenderness and tender trapezoid with o/w benign exam, for nsaid/flexeril prn, consider films or ortho eval but suspet main issue is overwork to the area given his occupation,  to f/u any worsening symptoms or concerns

## 2012-11-10 ENCOUNTER — Other Ambulatory Visit: Payer: Self-pay

## 2012-12-11 ENCOUNTER — Ambulatory Visit: Payer: 59 | Admitting: Family Medicine

## 2012-12-11 VITALS — BP 126/68 | HR 53 | Temp 97.7°F | Resp 16 | Ht 68.25 in | Wt 250.8 lb

## 2012-12-11 DIAGNOSIS — H9201 Otalgia, right ear: Secondary | ICD-10-CM

## 2012-12-11 DIAGNOSIS — H9209 Otalgia, unspecified ear: Secondary | ICD-10-CM

## 2012-12-11 DIAGNOSIS — H6123 Impacted cerumen, bilateral: Secondary | ICD-10-CM

## 2012-12-11 DIAGNOSIS — H6120 Impacted cerumen, unspecified ear: Secondary | ICD-10-CM

## 2012-12-11 NOTE — Progress Notes (Signed)
[facility]:  Office Visit  Chief Complaint:  Chief Complaint  Patient presents with  . Foreign Body in Ear    x 1 day    right  q-tip in ear    HPI: Blake Walsh is a 22 y.o. male who complains of  Right ear pain and muffled hearing, started this morning after going to deep into ear canal with q-tip. No feves, chills, n/v, facial pain or temporal pain, dizziness. He has no prior h/o recurrent ear infections.   Past Medical History  Diagnosis Date  . Allergic rhinitis   . ALLERGIC RHINITIS 01/29/2009  . CONJUNCTIVITIS, ALLERGIC 01/29/2009   No past surgical history on file. History   Social History  . Marital Status: Single    Spouse Name: N/A    Number of Children: N/A  . Years of Education: 55   Social History Main Topics  . Smoking status: Former Smoker    Start date: 11/13/2012  . Smokeless tobacco: None  . Alcohol Use: No  . Drug Use: No  . Sexually Active: No   Other Topics Concern  . None   Social History Narrative  . None   Family History  Problem Relation Age of Onset  . Hyperlipidemia Father    No Known Allergies Prior to Admission medications   Medication Sig Start Date End Date Taking? Authorizing Provider  cyclobenzaprine (FLEXERIL) 5 MG tablet Take 1 tablet (5 mg total) by mouth 3 (three) times daily as needed for muscle spasms. 11/05/12   Corwin Levins, MD  naproxen (NAPROSYN) 500 MG tablet Take 1 tablet (500 mg total) by mouth 2 (two) times daily with a meal. 11/05/12   Corwin Levins, MD     ROS: The patient denies fevers, chills, night sweats, unintentional weight loss, chest pain, palpitations, wheezing, dyspnea on exertion, nausea, vomiting, abdominal pain, dysuria, hematuria, melena, numbness, weakness, or tingling.  All other systems have been reviewed and were otherwise negative with the exception of those mentioned in the HPI and as above.    PHYSICAL EXAM: Filed Vitals:   12/11/12 1342  BP: 126/68  Pulse: 53  Temp: 97.7  F (36.5 C)  Resp: 16   Filed Vitals:   12/11/12 1342  Height: 5' 8.25" (1.734 m)  Weight: 250 lb 12.8 oz (113.762 kg)   Body mass index is 37.84 kg/(m^2).  General: Alert, no acute distress HEENT:  Normocephalic, atraumatic, oropharynx patent. Right ear + cerumen impaction. Post exam-right ear Tm slightly red, no drainage.  Cardiovascular:  Regular rate and rhythm, no rubs murmurs or gallops.  No Carotid bruits, radial pulse intact. No pedal edema.  Respiratory: Clear to auscultation bilaterally.  No wheezes, rales, or rhonchi.  No cyanosis, no use of accessory musculature GI: No organomegaly, abdomen is soft and non-tender, positive bowel sounds.  No masses. Skin: No rashes. Neurologic: Facial musculature symmetric. Psychiatric: Patient is appropriate throughout our interaction. Lymphatic: No cervical lymphadenopathy Musculoskeletal: Gait intact.   LABS: No results found for this or any previous visit.   EKG/XRAY:   Primary read interpreted by Dr. Conley Rolls at [facility].   ASSESSMENT/PLAN: Encounter Diagnoses  Name Primary?  . Otalgia of right ear Yes  . Cerumen impaction, bilateral    Ears were much improved, disimpacted successfully by Amado Coe No more muffled hearing Gave him a rx for Amox 875 mg BID x 10 days just in [place] he startst getting more ear pain. He might have early stage of OM.  Right TM slightly red.  F/u prn     Jameeka Marcy PHUONG, DO 12/11/2012 2:57 PM

## 2013-02-04 ENCOUNTER — Ambulatory Visit (INDEPENDENT_AMBULATORY_CARE_PROVIDER_SITE_OTHER): Payer: PRIVATE HEALTH INSURANCE | Admitting: Internal Medicine

## 2013-02-04 ENCOUNTER — Other Ambulatory Visit (INDEPENDENT_AMBULATORY_CARE_PROVIDER_SITE_OTHER): Payer: PRIVATE HEALTH INSURANCE

## 2013-02-04 ENCOUNTER — Encounter: Payer: Self-pay | Admitting: Internal Medicine

## 2013-02-04 VITALS — BP 110/72 | HR 50 | Temp 97.6°F | Ht 68.25 in | Wt 247.6 lb

## 2013-02-04 DIAGNOSIS — R109 Unspecified abdominal pain: Secondary | ICD-10-CM

## 2013-02-04 DIAGNOSIS — R319 Hematuria, unspecified: Secondary | ICD-10-CM

## 2013-02-04 LAB — POCT URINALYSIS DIPSTICK
Bilirubin, UA: NEGATIVE
Glucose, UA: NEGATIVE
Leukocytes, UA: NEGATIVE
Nitrite, UA: NEGATIVE

## 2013-02-04 LAB — URINALYSIS, ROUTINE W REFLEX MICROSCOPIC
Bilirubin Urine: NEGATIVE
Leukocytes, UA: NEGATIVE
Nitrite: NEGATIVE

## 2013-02-04 MED ORDER — CIPROFLOXACIN HCL 500 MG PO TABS: 500.0000 mg | ORAL_TABLET | Freq: Two times a day (BID) | ORAL | Status: DC

## 2013-02-04 NOTE — Patient Instructions (Signed)

## 2013-02-04 NOTE — Progress Notes (Deleted)
Pt presents to the clinic today with c/o

## 2013-02-04 NOTE — Progress Notes (Signed)
HPI  Pt presents to the clinic today with c/o urinary frequency, urgency and blood in urine. He also c/o left flank pain. This started this morning. He is not sexually active. He has no penile discharge. His urine has gotten progressively darker over the course of the day. He denies fever, chills or body aches. He has never had a UTI or prostate infection in the past. He has had not trauma to his back in the kidney area. He has no history of kidney stones.   Review of Systems  Past Medical History  Diagnosis Date  . Allergic rhinitis   . ALLERGIC RHINITIS 01/29/2009  . CONJUNCTIVITIS, ALLERGIC 01/29/2009    Family History  Problem Relation Age of Onset  . Hyperlipidemia Father     History   Social History  . Marital Status: Single    Spouse Name: N/A    Number of Children: N/A  . Years of Education: 12   Occupational History  . Not on file.   Social History Main Topics  . Smoking status: Former Smoker    Start date: 11/13/2012  . Smokeless tobacco: Not on file  . Alcohol Use: No  . Drug Use: No  . Sexually Active: No   Other Topics Concern  . Not on file   Social History Narrative  . No narrative on file    No Known Allergies  Constitutional: Denies fever, malaise, fatigue, headache or abrupt weight changes.   GU: Pt reports urgency, frequency and blood in urine. Denies burning sensation, odor or discharge. Skin: Denies redness, rashes, lesions or ulcercations.   No other specific complaints in a complete review of systems (except as listed in HPI above).    Objective:   Physical Exam  BP 110/72  Pulse 50  Temp(Src) 97.6 F (36.4 C) (Oral)  Ht 5' 8.25" (1.734 m)  Wt 247 lb 9.6 oz (112.311 kg)  BMI 37.35 kg/m2  SpO2 98% Wt Readings from Last 3 Encounters:  02/04/13 247 lb 9.6 oz (112.311 kg)  12/11/12 250 lb 12.8 oz (113.762 kg)  11/05/12 253 lb 8 oz (114.987 kg)    General: Appears his stated age, well developed, well nourished in  NAD. Cardiovascular: Normal rate and rhythm. S1,S2 noted.  No murmur, rubs or gallops noted. No JVD or BLE edema. No carotid bruits noted. Pulmonary/Chest: Normal effort and positive vesicular breath sounds. No respiratory distress. No wheezes, rales or ronchi noted.  Abdomen: Soft and nontender. Normal bowel sounds, no bruits noted. No distention or masses noted. Liver, spleen and kidneys non palpable. Tender to palpation over the bladder area. No CVA tenderness.      Assessment & Plan:   Hematuria and flank pain, new onset:  Drink plenty of water Will send urine for urinalysis and urine culture Will start Cipro 500 mg BID x 5 days- will call you if culture negative   RTC as needed or if symptoms persist.

## 2013-02-05 LAB — URINE CULTURE: Colony Count: 4000

## 2013-02-06 ENCOUNTER — Telehealth: Payer: Self-pay

## 2013-02-06 NOTE — Telephone Encounter (Signed)
To forward to his provider that day - Rene Kocher

## 2013-02-06 NOTE — Telephone Encounter (Signed)
Pt calls requesting his urine test results

## 2013-02-07 NOTE — Telephone Encounter (Signed)
L/m for pt to return call

## 2013-02-07 NOTE — Telephone Encounter (Signed)
Call pt and let him know culture came back negative for infection. Go ahead and stop the antibiotic. Let us know if hematuria continues.

## 2013-02-07 NOTE — Telephone Encounter (Signed)
Pt notified in previous msg

## 2013-02-10 ENCOUNTER — Other Ambulatory Visit: Payer: Self-pay | Admitting: Internal Medicine

## 2013-02-19 ENCOUNTER — Encounter: Payer: Self-pay | Admitting: Internal Medicine

## 2013-02-19 ENCOUNTER — Ambulatory Visit (INDEPENDENT_AMBULATORY_CARE_PROVIDER_SITE_OTHER): Payer: PRIVATE HEALTH INSURANCE | Admitting: Internal Medicine

## 2013-02-19 VITALS — BP 102/62 | HR 50 | Temp 98.1°F | Ht 68.25 in | Wt 245.0 lb

## 2013-02-19 DIAGNOSIS — J309 Allergic rhinitis, unspecified: Secondary | ICD-10-CM

## 2013-02-19 MED ORDER — FLUTICASONE PROPIONATE 50 MCG/ACT NA SUSP: 2.0000 | Freq: Every day | NASAL | Status: DC

## 2013-02-19 MED ORDER — METHYLPREDNISOLONE ACETATE 80 MG/ML IJ SUSP
80.0000 mg | Freq: Once | INTRAMUSCULAR | Status: AC
  Administered 2013-02-19: 80 mg via INTRAMUSCULAR

## 2013-02-19 NOTE — Addendum Note (Signed)
Addended by: Brenton Grills C on: 02/19/2013 10:30 AM   Modules accepted: Orders

## 2013-02-19 NOTE — Patient Instructions (Signed)
Allergic Rhinitis Allergic rhinitis is when the mucous membranes in the nose respond to allergens. Allergens are particles in the air that cause your body to have an allergic reaction. This causes you to release allergic antibodies. Through a chain of events, these eventually cause you to release histamine into the blood stream (hence the use of antihistamines). Although meant to be protective to the body, it is this release that causes your discomfort, such as frequent sneezing, congestion and an itchy runny nose.  CAUSES  The pollen allergens may come from grasses, trees, and weeds. This is seasonal allergic rhinitis, or "hay fever." Other allergens cause year-round allergic rhinitis (perennial allergic rhinitis) such as house dust mite allergen, pet dander and mold spores.  SYMPTOMS   Nasal stuffiness (congestion).  Runny, itchy nose with sneezing and tearing of the eyes.  There is often an itching of the mouth, eyes and ears. It cannot be cured, but it can be controlled with medications. DIAGNOSIS  If you are unable to determine the offending allergen, skin or blood testing may find it. TREATMENT   Avoid the allergen.  Medications and allergy shots (immunotherapy) can help.  Hay fever may often be treated with antihistamines in pill or nasal spray forms. Antihistamines block the effects of histamine. There are over-the-counter medicines that may help with nasal congestion and swelling around the eyes. Check with your caregiver before taking or giving this medicine. If the treatment above does not work, there are many new medications your caregiver can prescribe. Stronger medications may be used if initial measures are ineffective. Desensitizing injections can be used if medications and avoidance fails. Desensitization is when a patient is given ongoing shots until the body becomes less sensitive to the allergen. Make sure you follow up with your caregiver if problems continue. SEEK MEDICAL  CARE IF:   You develop fever (more than 100.5 F (38.1 C).  You develop a cough that does not stop easily (persistent).  You have shortness of breath.  You start wheezing.  Symptoms interfere with normal daily activities. Document Released: 06/07/2001 Document Revised: 12/05/2011 Document Reviewed: 12/17/2008 Exit[employer] Patient Information 2014 [employer].  

## 2013-02-19 NOTE — Progress Notes (Signed)
HPI  Pt presents to the clinic today with c/o allergy symptoms x 3 weeks. The worst part is the sore throat and nagging cough. He does not produce any sputum. He is taking flonase and an OTC anthistamine. He usually has to come get a steroid shot to make his medications more effective during allergy season. He has not had sick contacts.  Review of Systems      Past Medical History  Diagnosis Date  . Allergic rhinitis   . ALLERGIC RHINITIS 01/29/2009  . CONJUNCTIVITIS, ALLERGIC 01/29/2009    Family History  Problem Relation Age of Onset  . Hyperlipidemia Father     History   Social History  . Marital Status: Single    Spouse Name: N/A    Number of Children: N/A  . Years of Education: 12   Occupational History  . Not on file.   Social History Main Topics  . Smoking status: Former Smoker    Start date: 11/13/2012  . Smokeless tobacco: Not on file  . Alcohol Use: No  . Drug Use: No  . Sexually Active: No   Other Topics Concern  . Not on file   Social History Narrative  . No narrative on file    No Known Allergies   Constitutional:. Denies headache, fatigue, fever or abrupt weight changes.  HEENT:  Positive sore throat. Denies eye redness, eye pain, pressure behind the eyes, facial pain, nasal congestion, ear pain, ringing in the ears, wax buildup, runny nose or bloody nose. Respiratory: Positive cough. Denies difficulty breathing or shortness of breath.  Cardiovascular: Denies chest pain, chest tightness, palpitations or swelling in the hands or feet.   No other specific complaints in a complete review of systems (except as listed in HPI above).  Objective:   BP 102/62  Pulse 50  Temp(Src) 98.1 F (36.7 C) (Oral)  Ht 5' 8.25" (1.734 m)  Wt 245 lb (111.131 kg)  BMI 36.96 kg/m2  SpO2 97% Wt Readings from Last 3 Encounters:  02/19/13 245 lb (111.131 kg)  02/04/13 247 lb 9.6 oz (112.311 kg)  12/11/12 250 lb 12.8 oz (113.762 kg)     General: Appears his  stated age, well developed, well nourished in NAD. HEENT: Head: normal shape and size; Eyes: sclera white, no icterus, conjunctiva pink, PERRLA and EOMs intact; Ears: Tm's gray and intact, normal light reflex; Nose: mucosa pink and moist, septum midline; Throat/Mouth: + PND. Teeth present, mucosa erythematous and moist, no exudate noted, no lesions or ulcerations noted.  Neck: Neck supple, trachea midline. No massses, lumps or thyromegaly present.  Cardiovascular: Normal rate and rhythm. S1,S2 noted.  No murmur, rubs or gallops noted. No JVD or BLE edema. No carotid bruits noted. Pulmonary/Chest: Normal effort and positive vesicular breath sounds. No respiratory distress. No wheezes, rales or ronchi noted.      Assessment & Plan:  Allergic Rhinitis, recurrent:  Get some rest and drink plenty of water Refilled flonase today and continue OC antihistamine 80 mg Depo IM today  RTC as needed or if symptoms persist.

## 2013-02-20 ENCOUNTER — Other Ambulatory Visit: Payer: Self-pay | Admitting: Internal Medicine

## 2013-03-21 ENCOUNTER — Other Ambulatory Visit: Payer: Self-pay | Admitting: Internal Medicine

## 2013-06-06 ENCOUNTER — Other Ambulatory Visit: Payer: Self-pay | Admitting: Internal Medicine

## 2013-06-19 ENCOUNTER — Encounter: Payer: Self-pay | Admitting: Internal Medicine

## 2013-06-19 ENCOUNTER — Ambulatory Visit (INDEPENDENT_AMBULATORY_CARE_PROVIDER_SITE_OTHER)
Admission: RE | Admit: 2013-06-19 | Discharge: 2013-06-19 | Disposition: A | Discharge status: Home / Self Care | Payer: PRIVATE HEALTH INSURANCE | Source: Ambulatory Visit | Attending: Internal Medicine | Admitting: Internal Medicine

## 2013-06-19 ENCOUNTER — Ambulatory Visit (INDEPENDENT_AMBULATORY_CARE_PROVIDER_SITE_OTHER): Payer: PRIVATE HEALTH INSURANCE | Admitting: Internal Medicine

## 2013-06-19 VITALS — BP 102/62 | HR 50 | Temp 97.8°F | Ht 68.0 in | Wt 241.4 lb

## 2013-06-19 DIAGNOSIS — R109 Unspecified abdominal pain: Secondary | ICD-10-CM

## 2013-06-19 DIAGNOSIS — R3129 Other microscopic hematuria: Secondary | ICD-10-CM

## 2013-06-19 MED ORDER — HYDROCODONE-ACETAMINOPHEN 5-325 MG PO TABS: 1.0000 | ORAL_TABLET | Freq: Four times a day (QID) | ORAL | Status: DC | PRN

## 2013-06-19 MED ORDER — TAMSULOSIN HCL 0.4 MG PO CAPS: 0.4000 mg | ORAL_CAPSULE | Freq: Every day | ORAL | Status: DC

## 2013-06-19 MED ORDER — KETOROLAC TROMETHAMINE 30 MG/ML IJ SOLN
30.0000 mg | Freq: Once | INTRAMUSCULAR | Status: AC
  Administered 2013-06-19: 30 mg via INTRAMUSCULAR

## 2013-06-19 NOTE — Assessment & Plan Note (Signed)
For /fu ua as above

## 2013-06-19 NOTE — Assessment & Plan Note (Signed)
High suspicion for left ureteral stone intermittently symptomatic, unable to pass so far;  Now with increased typical pain, no fever or gross hematuria but with microhematuria noted with similar pain may 2014; for toradol 30 mg IM now, hydrocodone prn after that, drink more fluids, strain urine, flomax daily, for labs and UA, CT stone protocol today, and refer urology

## 2013-06-19 NOTE — Progress Notes (Signed)
  Subjective:    Patient ID: Blake Walsh, male    DOB: Jan 31, 1991, 22 y.o.   MRN: [id number]  HPI  Here to f/u, c/o onset left flank and lower back pain with radiation to the groin and left testicle, first symptomatic about may 12, saw our NP then, UA with microhemturia, pain resolved in 2 days, antibx stopped,  Did well for 3-4 wks, then started up again mild to mod intermittent but just not resolving as it seemed before,  No fever but some nausea. No vomiting.  No position seems to help.  Pain not worse with standing or bending. No gross hematuria.  No prior hx of stones.  No FH. No obvious passage of stone to date.  Couldn't sleep due to pain last night for > 6 hrs Past Medical History  Diagnosis Date  . Allergic rhinitis   . ALLERGIC RHINITIS 01/29/2009  . CONJUNCTIVITIS, ALLERGIC 01/29/2009   No past surgical history on file.  reports that he has quit smoking. He started smoking about 7 months ago. He does not have any smokeless tobacco history on file. He reports that he does not drink alcohol or use illicit drugs. family history includes Hyperlipidemia in his father. No Known Allergies Current Outpatient Prescriptions on File Prior to Visit  Medication Sig Dispense Refill  . naproxen (NAPROSYN) 500 MG tablet Take 1 tablet (500 mg total) by mouth 2 (two) times daily with a meal.  60 tablet  2  . cyclobenzaprine (FLEXERIL) 5 MG tablet Take one tablet by mouth three times daily as needed  for muscle spasm  60 tablet  6  . fluticasone (FLONASE) 50 MCG/ACT nasal spray Place 2 sprays into the nose daily.  16 g  5  . TGT 12 HOUR NASAL DECONGESTANT 120 MG 12 hr tablet TAKE 1 TABLET BY MOUTH TWICE A DAY AS NEEDED FOR CONGESTION  20 tablet  0   No current facility-administered medications on file prior to visit.   Review of Systems  Constitutional: Negative for unexpected weight change, or unusual diaphoresis  HENT: Negative for tinnitus.   Eyes: Negative for photophobia and visual disturbance.   Respiratory: Negative for choking and stridor.   Gastrointestinal: Negative for vomiting and blood in stool.  Genitourinary: Negative for hematuria and decreased urine volume.  Musculoskeletal: Negative for acute joint swelling Skin: Negative for color change and wound.  Neurological: Negative for tremors and numbness other than noted  Psychiatric/Behavioral: Negative for decreased concentration or  hyperactivity.       Objective:   Physical Exam BP 102/62  Pulse 50  Temp(Src) 97.8 F (36.6 C) (Oral)  Ht 5\' 8"  (1.727 m)  Wt 241 lb 6 oz (109.487 kg)  BMI 36.71 kg/m2  SpO2 97% VS noted,  Constitutional: Pt appears well-developed and well-nourished.  HENT: Head: NCAT.  Right Ear: External ear normal.  Left Ear: External ear normal.  Eyes: Conjunctivae and EOM are normal. Pupils are equal, round, and reactive to light.  Neck: Normal range of motion. Neck supple.  Cardiovascular: Normal rate and regular rhythm.   Pulmonary/Chest: Effort normal and breath sounds normal.  Abd:  Soft, NT, non-distended, + BS, no flank tender Neurological: Pt is alert. Not confused , motor 5/5 Skin: Skin is warm. No erythema. No spine or paravertebral tender Psychiatric: Pt behavior is normal. Thought content normal.     Assessment & Plan:

## 2013-06-19 NOTE — Patient Instructions (Signed)
You had the pain shot today (toradol) Please take all new medication as prescribed  - the flomax daily to help with trying to pass the stone, and hydrocodone for pain as needed Please drink more fluids, and strain the urine to see if can catch and prove the stone is there if passes on its own Please go to the LAB in the Basement (turn left off the elevator) for the tests to be done today You will be contacted regarding the referral for: CT scan - to see [facility] now You will be contacted regarding the referral for: urology  Please remember to sign up for My Chart if you have not done so, as this will be important to you in the future with finding out test results, communicating by private email, and scheduling acute appointments online when needed.

## 2013-07-09 ENCOUNTER — Other Ambulatory Visit: Payer: Self-pay | Admitting: Urology

## 2013-07-12 ENCOUNTER — Encounter (HOSPITAL_COMMUNITY): Payer: Self-pay | Admitting: *Deleted

## 2013-07-12 ENCOUNTER — Encounter (HOSPITAL_COMMUNITY): Payer: Self-pay | Admitting: Pharmacy Technician

## 2013-07-12 NOTE — Progress Notes (Signed)
Pre Procedure Lithotripsy phone call:Spoke to patient via phone,history obtained,updated.  Bring blue folder,insurance cards,picture ID,designated driver and living will,POA, if desires (to be placed on chart). Reinforced no aspirin(instructions to hold aspirin per your doctor), ibuprofen naproxen or Toradol products 72 hours prior to procedure. No vitamins or herbal medicines 7 days prior to procedure.   Follow laxative instructions provided by urologist (office) and in blue folder. Wear easy on/off clothing and no jewelry except wedding rings and ear rings. Leave all other valuables at home. Verbalizes understanding of instructions

## 2013-07-14 DIAGNOSIS — N201 Calculus of ureter: Secondary | ICD-10-CM

## 2013-07-14 HISTORY — DX: Calculus of ureter: N20.1

## 2013-07-14 NOTE — H&P (Signed)
History of Present Illness      Patient is 13M c/o onset left flank and lower back pain with radiation to the groin and left testicle.  He had an episode of renal colic in May 2014, saw a NP then. A UA was performed showing microhemturia and he was treated with Abx, his pain resolved in 2 days, and he the stopped the antibx.  Did well for 3-4 wks, then started up again with mild to mod intermittent left flank pain.  The patient had intermittent pain for several weeks and finally presented to his primary care doctor and a CT scan was obtained.   He has not had any pain since that time.  He denies any flank pain, hematuria, fevers, chills, nausea, vomiting, or dysuria.  He has not seen a stone past but is not filtering his urine.  No prior hx of stones. No FH of stones.     Past Medical History Problems  1. History of  No Medical Problems  Surgical History Problems  1. History of  No Surgical Problems  Current Meds 1. Fluticasone Propionate 50 MCG/ACT Nasal Suspension; Therapy: 27May2014 to 2. Hydrocodone-Acetaminophen 5-325 MG Oral Tablet; Therapy: 24Sep2014 to 3. Tamsulosin HCl 0.4 MG Oral Capsule; Therapy: 24Sep2014 to  Allergies Medication  1. No Known Drug Allergies  Family History Problems  1. Family history of  No Significant Family History  Social History Problems    Caffeine Use 1 per day   Marital History - Single   Tobacco Use 305.1 1/2 ppd since 2009 Denied    History of  Alcohol Use  Review of Systems     A 12 point comprehensive review of systems was obtained and is negative unless otherwise stated in the HPI with the following additions: Negative     Vitals Vital Signs  BMI Calculated: 37.13 BSA Calculated: 2.23 Height: 5 ft 8 in Weight: 245 lb  Blood Pressure: 113 / 71 Temperature: 98.4 F Heart Rate: 51  Physical Exam Constitutional: Well nourished and well developed . No acute distress.  ENT:. The ears and nose are normal in appearance.   Neck: The appearance of the neck is normal and no neck mass is present.  Skin: Normal skin turgor, no visible rash and no visible skin lesions.  Neuro/Psych:. Mood and affect are appropriate.    Results/Data Urine [ COLOR YELLOW   APPEARANCE CLEAR   SPECIFIC GRAVITY 1.025   pH 6.0   GLUCOSE NEG mg/dL  BILIRUBIN NEG   KETONE NEG mg/dL  BLOOD NEG   PROTEIN NEG mg/dL  UROBILINOGEN 0.2 mg/dL  NITRITE NEG   LEUKOCYTE ESTERASE NEG        A KUB was obtained while the patient was in clinic today: The kidney shadows are noted in the anatomic position with no evidence of calculi within the shadows.  There is a radio opaque area the proximal to mid left ureter which correlates with the area of the stone seen on the CT scan which was performed approximately 3 weeks ago.  There are no calcifications within the pelvis.   Assessment     22 year old male with a left nonobstructing mid ureteral stone which is visible on KUB.   Plan    I discussed the options for managing  none obstructing kidney stones.  He understands that his stone is likely to increase in size, become more painful, and that over time this may cause obstructing to part of his kidney resulting in decreased renal function.  I went over conservative management consisting of surviellence with serial imaging with KUB and renal ultrasound to assess for interval growth.  We also discussed more aggressive interventions including Shockwave lithotripsy and ureteroscopy.  I briefly explained to him what the different treatment modalities consisted of, and what he might expect with each treatment.    I have recommended the patient undergo shockwave lithotripsy which we'll schedule at his earliest convenience.

## 2013-07-15 ENCOUNTER — Encounter (HOSPITAL_COMMUNITY)
Admission: RE | Disposition: A | Discharge status: Home / Self Care | Payer: Self-pay | Source: Ambulatory Visit | Attending: Urology

## 2013-07-15 ENCOUNTER — Ambulatory Visit (HOSPITAL_COMMUNITY)
Admission: RE | Admit: 2013-07-15 | Discharge: 2013-07-15 | Disposition: A | Discharge status: Home / Self Care | Payer: 59 | Source: Ambulatory Visit | Attending: Urology | Admitting: Urology

## 2013-07-15 ENCOUNTER — Ambulatory Visit (HOSPITAL_COMMUNITY): Payer: 59

## 2013-07-15 ENCOUNTER — Encounter (HOSPITAL_COMMUNITY): Payer: Self-pay | Admitting: General Practice

## 2013-07-15 DIAGNOSIS — F17200 Nicotine dependence, unspecified, uncomplicated: Secondary | ICD-10-CM | POA: Insufficient documentation

## 2013-07-15 DIAGNOSIS — N201 Calculus of ureter: Secondary | ICD-10-CM | POA: Diagnosis present

## 2013-07-15 HISTORY — DX: Calculus of kidney: N20.0

## 2013-07-15 SURGERY — LITHOTRIPSY, ESWL
Anesthesia: LOCAL | Laterality: Left

## 2013-07-15 MED ORDER — CIPROFLOXACIN HCL 500 MG PO TABS
500.0000 mg | ORAL_TABLET | ORAL | Status: AC
  Administered 2013-07-15: 500 mg via ORAL
  Filled 2013-07-15: qty 1

## 2013-07-15 MED ORDER — DIPHENHYDRAMINE HCL 25 MG PO CAPS
25.0000 mg | ORAL_CAPSULE | ORAL | Status: AC
  Administered 2013-07-15: 25 mg via ORAL
  Filled 2013-07-15: qty 1

## 2013-07-15 MED ORDER — DIAZEPAM 5 MG PO TABS
10.0000 mg | ORAL_TABLET | ORAL | Status: AC
  Administered 2013-07-15: 10 mg via ORAL
  Filled 2013-07-15: qty 2

## 2013-07-15 MED ORDER — SODIUM CHLORIDE 0.9 % IV SOLN
INTRAVENOUS | Status: DC
  Administered 2013-07-15: 07:00:00 via INTRAVENOUS

## 2013-07-15 MED ORDER — TAMSULOSIN HCL 0.4 MG PO CAPS: 0.4000 mg | ORAL_CAPSULE | ORAL | Status: DC

## 2013-07-15 MED ORDER — OXYCODONE-ACETAMINOPHEN 10-325 MG PO TABS: 1.0000 | ORAL_TABLET | ORAL | Status: DC | PRN

## 2013-07-15 NOTE — Interval H&P Note (Signed)
History and Physical Interval Note:  07/15/2013 7:42 AM  Blake Walsh  has presented today for surgery, with the diagnosis of Left Ureteral stone  The various methods of treatment have been discussed with the patient and family. After consideration of risks, benefits and other options for treatment, the patient has consented to  Procedure(s): LEFT EXTRACORPOREAL SHOCK WAVE LITHOTRIPSY (ESWL) (Left) as a surgical intervention .  The patient's history has been reviewed, patient examined, no change in status, stable for surgery.  I have reviewed the patient's chart and labs.  Questions were answered to the patient's satisfaction.     [place]

## 2013-07-15 NOTE — Op Note (Signed)
See [facility] OP note scanned into chart. 

## 2013-08-01 ENCOUNTER — Other Ambulatory Visit: Payer: Self-pay

## 2013-08-07 ENCOUNTER — Other Ambulatory Visit: Payer: Self-pay | Admitting: Internal Medicine

## 2013-11-05 ENCOUNTER — Other Ambulatory Visit: Payer: Self-pay | Admitting: Internal Medicine

## 2014-09-30 IMAGING — CT CT ABD-PELV W/O CM
2 of 4 series · 17 of 46 positions shown, 19 images · non-contrast
Comparison: None.

CLINICAL DATA: Recurrent left flank pain. Micro hematuria.

EXAM:
CT ABDOMEN AND PELVIS WITHOUT CONTRAST
TECHNIQUE: Multidetector CT imaging of the abdomen and pelvis was performed
following the standard protocol without intravenous contrast.

[Series 2: ap stone study · axial · 0.80mm/px · z∈[-460,-20]mm · 14 of 97 slices shown, 16 images]
[im 5/97  soft-tissue]
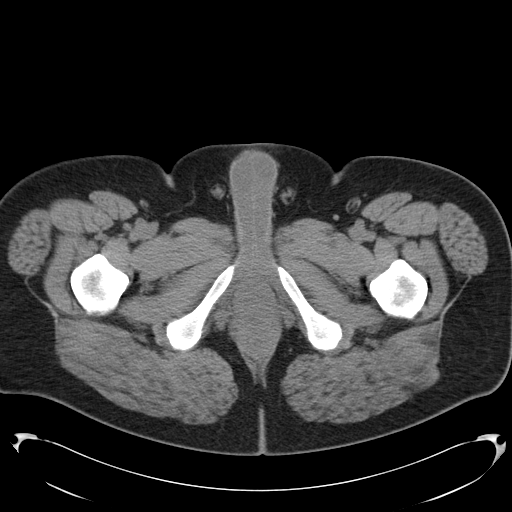
[im 5/97  bone]
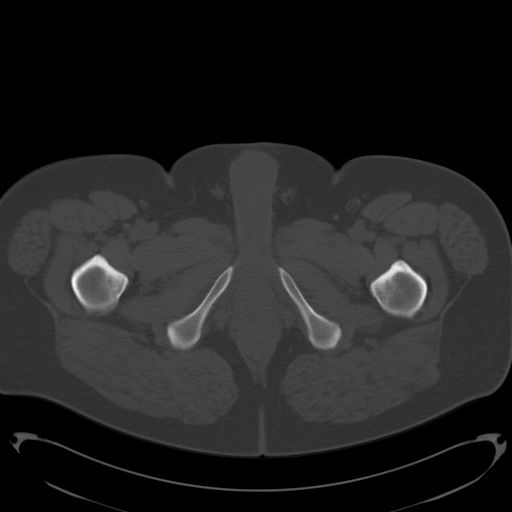
[im 13/97  soft-tissue]
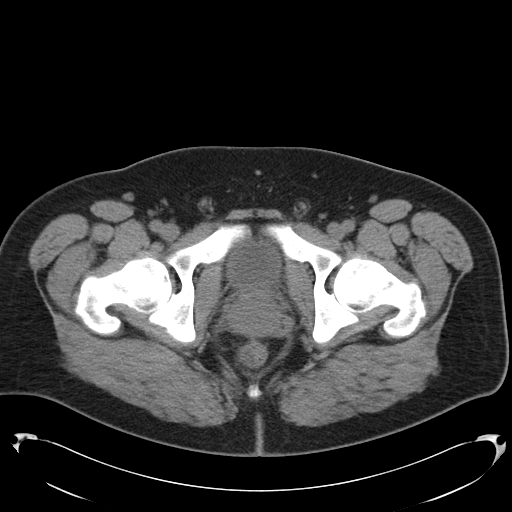
[im 21/97  soft-tissue]
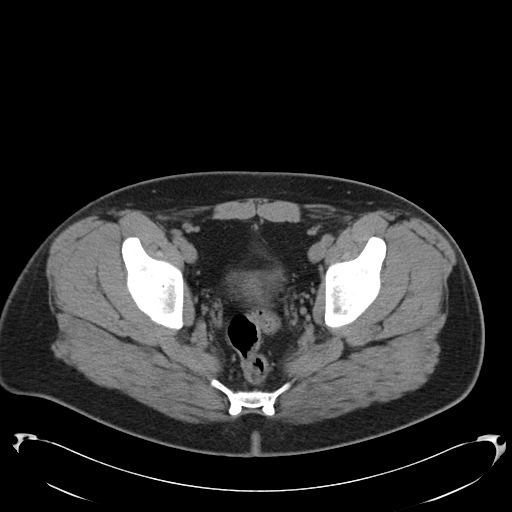
[im 25/97  soft-tissue]
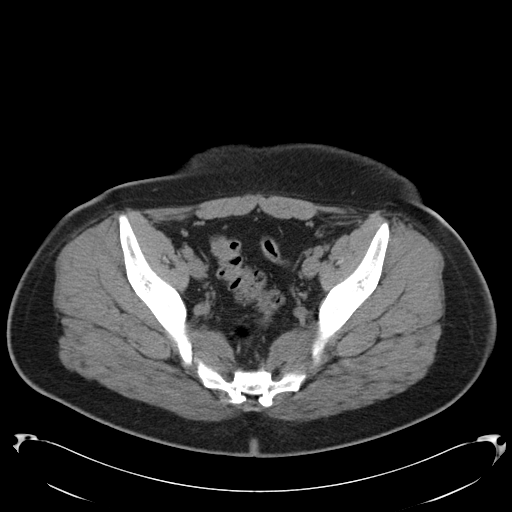
[im 33/97  soft-tissue]
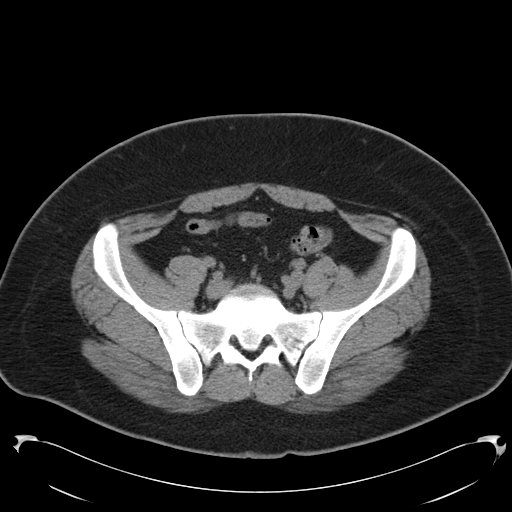
[im 41/97  soft-tissue]
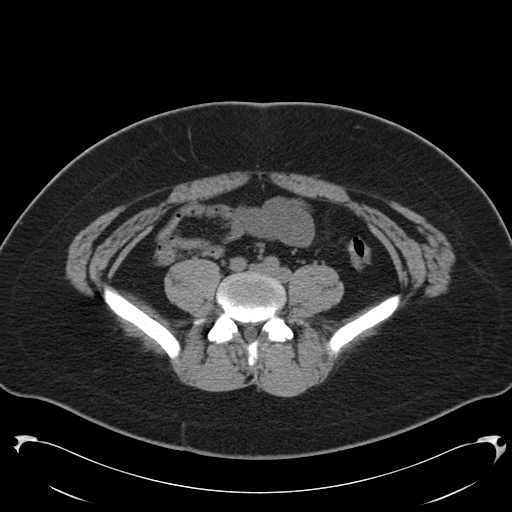
[im 45/97  soft-tissue]
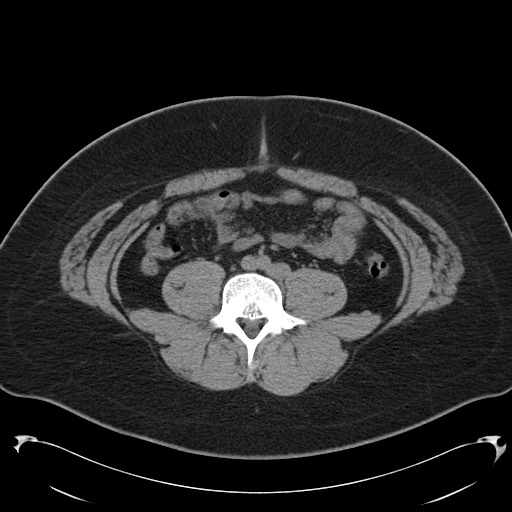
[im 53/97  soft-tissue]
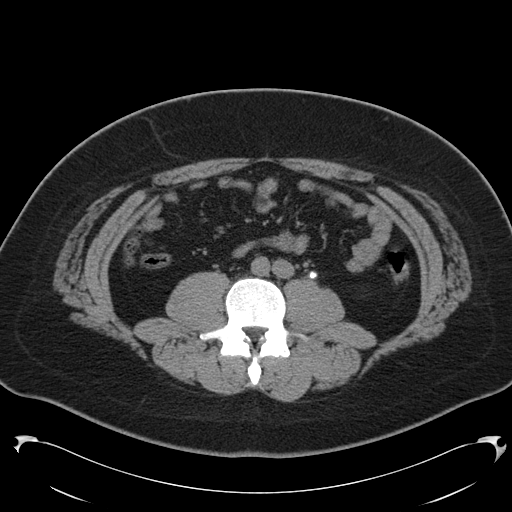
[im 57/97  soft-tissue]
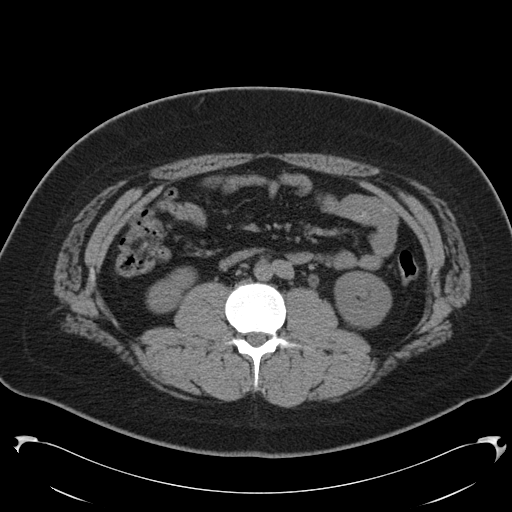
[im 57/97  bone]
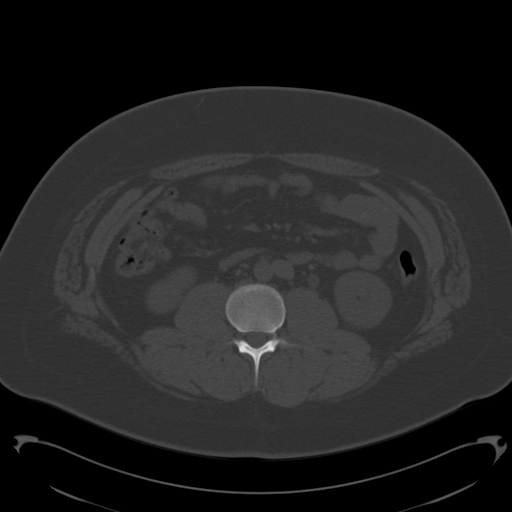
[im 65/97  soft-tissue]
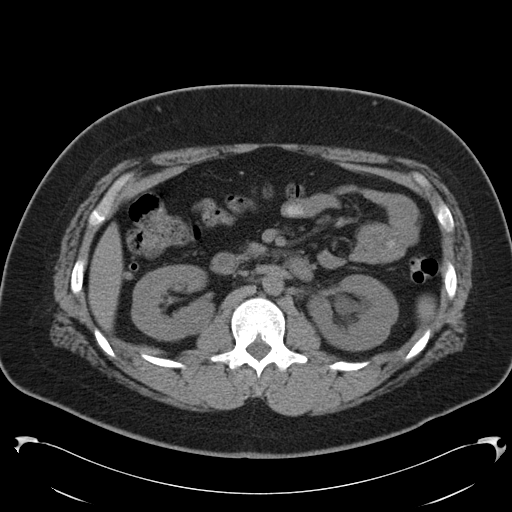
[im 73/97  soft-tissue]
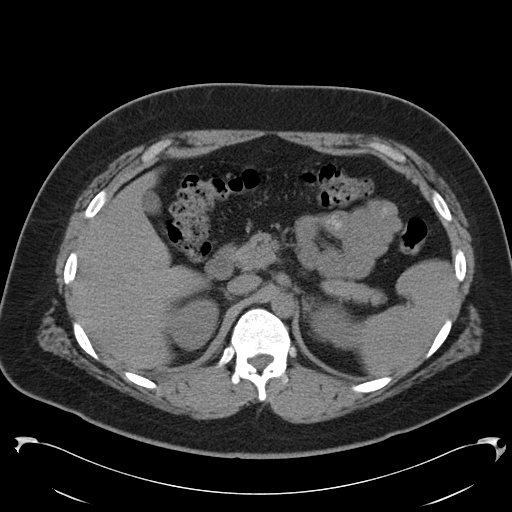
[im 77/97  soft-tissue]
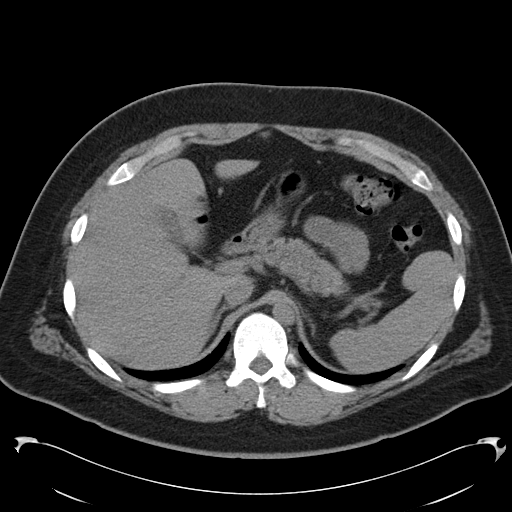
[im 85/97  soft-tissue]
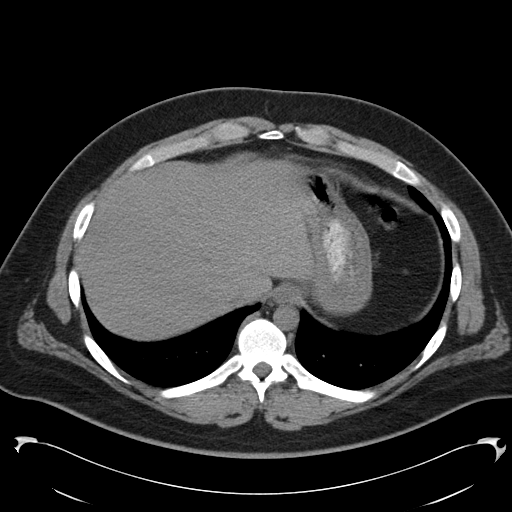
[im 93/97  soft-tissue]
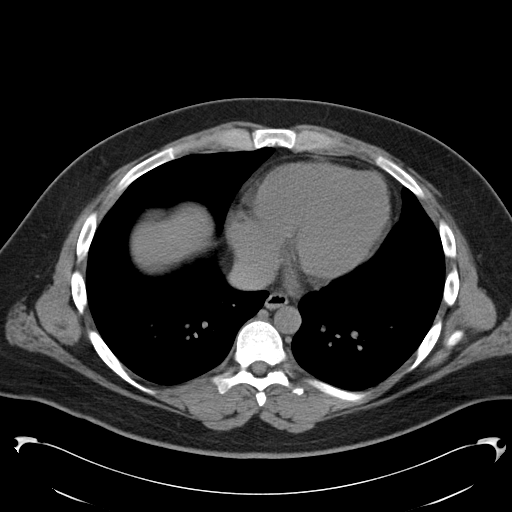

[Series 602: cor · coronal · 0.97mm/px · 3 of 125 slices shown]
[im 42/125  soft-tissue]
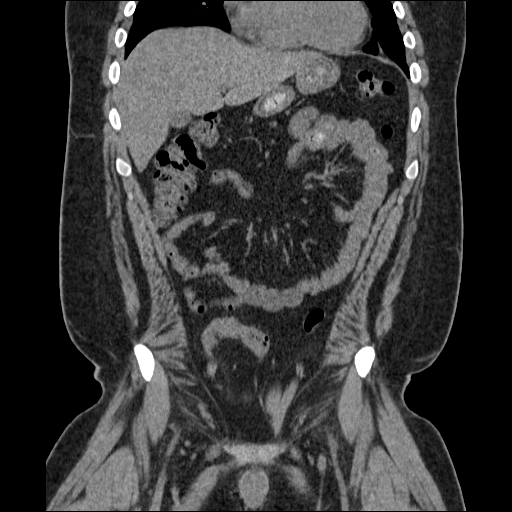
[im 56/125  soft-tissue]
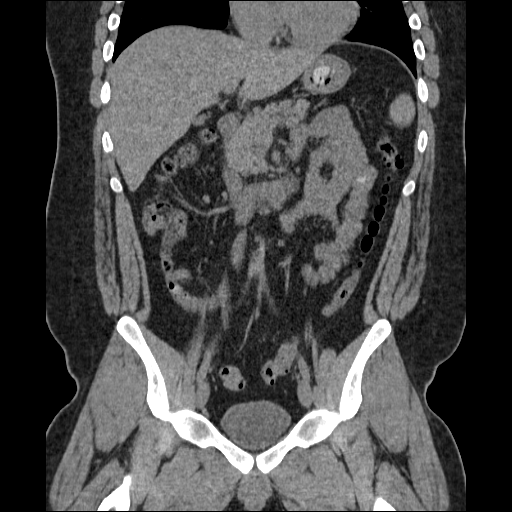
[im 69/125  soft-tissue]
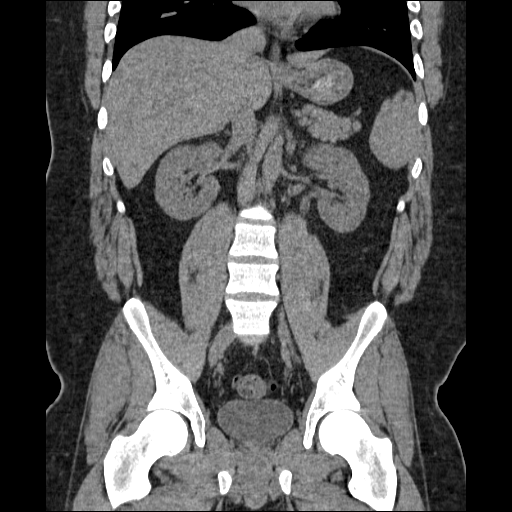

[17 of 46 positions shown; findings below may reference images not displayed]

FINDINGS: Mild to moderate left hydronephrosis is seen. This is due to a 5 mm
calculus in the proximal left ureter. No other urinary tract calculi
identified. No evidence of perinephric fluid or inflammatory
changes.

The other abdominal parenchymal organs have a normal appearance on
this noncontrast study. Normal appendix is visualized. No evidence
of inflammatory process or abnormal fluid collections. No soft
tissue masses or lymphadenopathy identified. Incidental note is made
of congenital left-sided IVC.
IMPRESSION: 5 mm proximal left ureteral calculus causing mild to moderate left
hydronephrosis.

## 2014-10-26 IMAGING — CR DG ABDOMEN 1V
2 series · 2 of 2 positions shown · non-contrast
Comparison: 07/08/2013

CLINICAL DATA: Left-sided renal stone. Pre lithotripsy.

EXAM:
ABDOMEN - 1 VIEW

[t abdomen supine (1 of 2)]
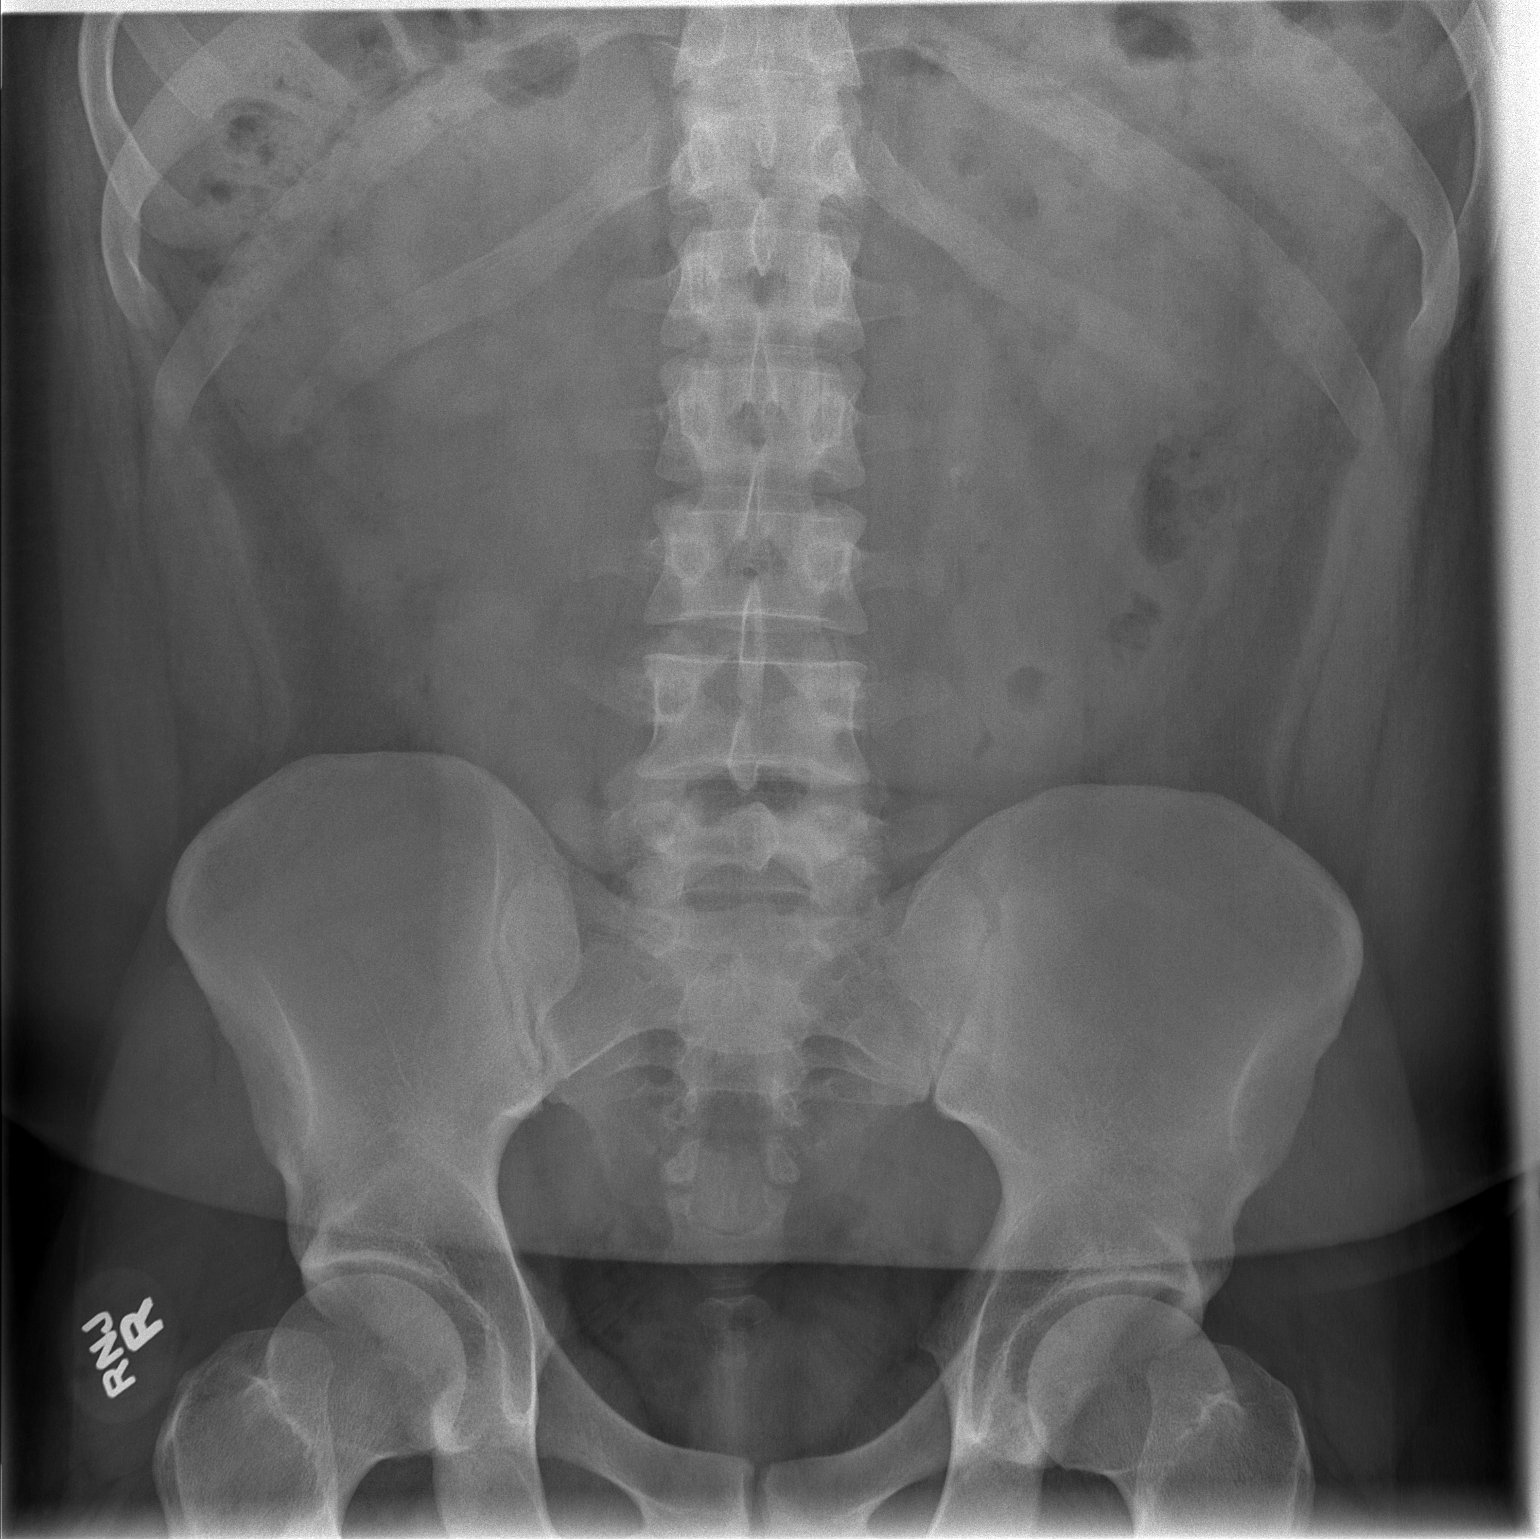

[t abdomen supine (2 of 2)]
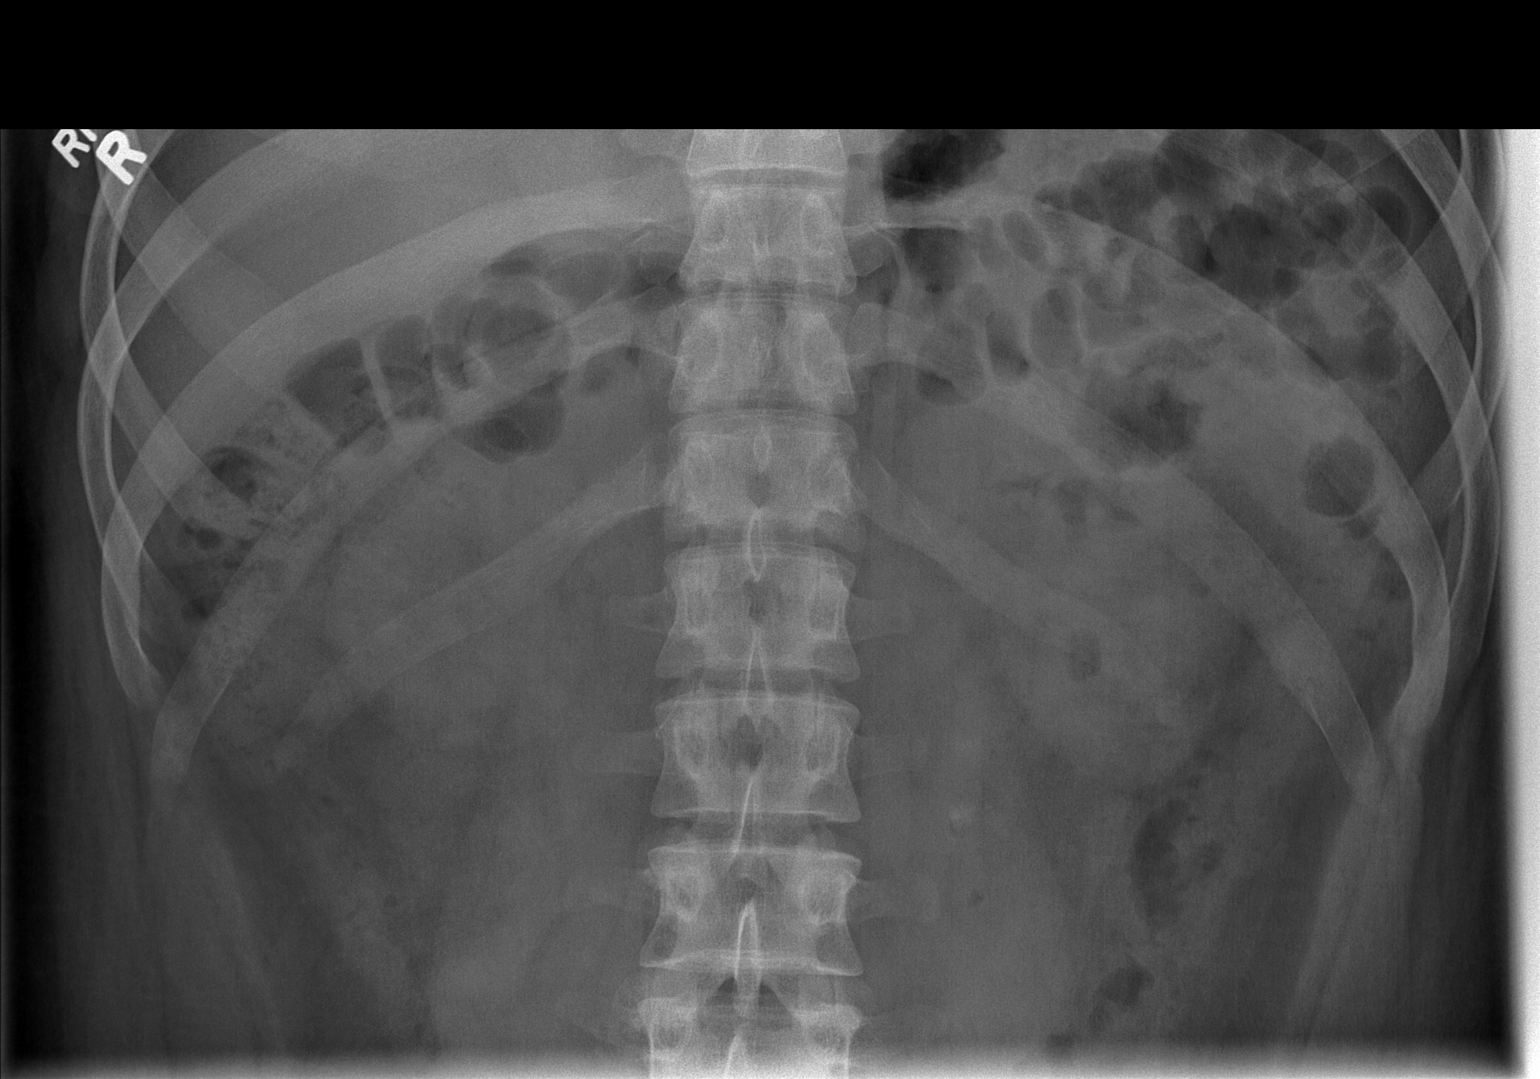

[2 of 2 positions shown; findings below may reference images not displayed]

FINDINGS: There is a vague calcification in the left abdomen to the left of at
L2-3 consistent with a ureteral stone. This measures about 6.5 mm
diameter and corresponds to the stones seen on the previous
radiograph. No additional stones are demonstrated. Bowel gas pattern
is normal. Visualized bones appear intact.
IMPRESSION: Calculus projecting to the left of L2-3 consistent with left
proximal ureteral stone. Stable location and size since previous
study.

## 2015-01-14 ENCOUNTER — Encounter: Payer: Self-pay | Admitting: Internal Medicine

## 2015-01-14 ENCOUNTER — Ambulatory Visit (INDEPENDENT_AMBULATORY_CARE_PROVIDER_SITE_OTHER): Payer: PRIVATE HEALTH INSURANCE | Admitting: Internal Medicine

## 2015-01-14 VITALS — BP 120/70 | HR 66 | Temp 98.5°F | Resp 18 | Ht 68.0 in | Wt 194.1 lb

## 2015-01-14 DIAGNOSIS — H1013 Acute atopic conjunctivitis, bilateral: Secondary | ICD-10-CM

## 2015-01-14 DIAGNOSIS — J309 Allergic rhinitis, unspecified: Secondary | ICD-10-CM | POA: Diagnosis not present

## 2015-01-14 MED ORDER — FLUTICASONE PROPIONATE 50 MCG/ACT NA SUSP: 2.0000 | Freq: Every day | NASAL | Status: DC

## 2015-01-14 MED ORDER — CETIRIZINE HCL 10 MG PO TABS: 10.0000 mg | ORAL_TABLET | Freq: Every day | ORAL | Status: DC

## 2015-01-14 MED ORDER — AZELASTINE HCL 0.05 % OP SOLN: 1.0000 [drp] | Freq: Two times a day (BID) | OPHTHALMIC | Status: DC

## 2015-01-14 MED ORDER — METHYLPREDNISOLONE ACETATE 80 MG/ML IJ SUSP
80.0000 mg | Freq: Once | INTRAMUSCULAR | Status: AC
  Administered 2015-01-14: 80 mg via INTRAMUSCULAR

## 2015-01-14 MED ORDER — PREDNISONE 10 MG PO TABS: ORAL_TABLET | ORAL | Status: DC

## 2015-01-14 NOTE — Progress Notes (Signed)
   Subjective:    Patient ID: Blake MiyamotoKyle Farrel, male    DOB: September 05, 1991, 24 y.o.   MRN: [id number]  HPI  Here with 3 wks onset bilat eye and sinus congestion - Does have several wks ongoing nasal allergy symptoms with clearish congestion, itch and sneezing, without fever, pain, ST, cough, swelling or wheezing. Past Medical History  Diagnosis Date  . Allergic rhinitis   . ALLERGIC RHINITIS 01/29/2009  . CONJUNCTIVITIS, ALLERGIC 01/29/2009  . Kidney stone    Past Surgical History  Procedure Laterality Date  . None      reports that he has quit smoking. He started smoking about 2 years ago. He does not have any smokeless tobacco history on file. He reports that he does not drink alcohol or use illicit drugs. family history includes Hyperlipidemia in his father. No Known Allergies Current Outpatient Prescriptions on File Prior to Visit  Medication Sig Dispense Refill  . cyclobenzaprine (FLEXERIL) 5 MG tablet Take one tablet by mouth three times daily as needed  for muscle spasm (Patient not taking: Reported on 01/14/2015) 60 tablet 6  . HYDROcodone-acetaminophen (NORCO/VICODIN) 5-325 MG per tablet Take 1 tablet by mouth every 6 (six) hours as needed for pain. (Patient not taking: Reported on 01/14/2015) 60 tablet 0  . naproxen (NAPROSYN) 500 MG tablet Take one tablet by mouth twice daily  with meals (Patient not taking: Reported on 01/14/2015) 60 tablet 11  . oxyCODONE-acetaminophen (PERCOCET) 10-325 MG per tablet Take 1-2 tablets by mouth every 4 (four) hours as needed for pain. (Patient not taking: Reported on 01/14/2015) 30 tablet 0  . TGT 12 HOUR NASAL DECONGESTANT 120 MG 12 hr tablet TAKE ONE TABLET BY MOUTH TWICE DAILY as needed FOR CONGESTION  (Patient not taking: Reported on 01/14/2015) 20 tablet 6   No current facility-administered medications on file prior to visit.   Review of Systems All otherwise neg per pt     Objective:   Physical Exam BP 120/70 mmHg  Pulse 66  Temp(Src) 98.5 F (36.9  C) (Oral)  Resp 18  Ht 5\' 8"  (1.727 m)  Wt 194 lb 1.3 oz (88.034 kg)  BMI 29.52 kg/m2  SpO2 96% VS noted, not ill appearing Constitutional: Pt appears in no significant distress HENT: Head: NCAT.  Right Ear: External ear normal.  Left Ear: External ear normal.  Eyes: . Pupils are equal, round, and reactive to light. Conjunctivae with bilat weepiness and erythema, and EOM are normal Bilat tm's with mild erythema.  Max sinus areas non tender.  Pharynx with mild erythema, no exudate Neck: Normal range of motion. Neck supple.  Cardiovascular: Normal rate and regular rhythm.   Pulmonary/Chest: Effort normal and breath sounds without rales or wheezing.  Neurological: Pt is alert. Not confused , motor grossly intact Skin: Skin is warm. No rash, no LE edema Psychiatric: Pt behavior is normal. No agitation.     Assessment & Plan:

## 2015-01-14 NOTE — Assessment & Plan Note (Signed)
For opitvar asd,  to f/u any worsening symptoms or concerns

## 2015-01-14 NOTE — Patient Instructions (Signed)
You had the steroid shot today  Please take all new medication as prescribed - the prednisone, zyrtec and flonase, and the eye drops  Please continue all other medications as before, and refills have been done if requested.  Please have the pharmacy call with any other refills you may need.  Please keep your appointments with your specialists as you may have planned

## 2015-01-14 NOTE — Assessment & Plan Note (Signed)
Mild to mod, for depomedrol IM, zyrtec, flonase asd,  to f/u any worsening symptoms or concerns

## 2016-01-12 ENCOUNTER — Encounter: Payer: Self-pay | Admitting: Internal Medicine

## 2016-01-12 ENCOUNTER — Other Ambulatory Visit: Payer: Self-pay | Admitting: Internal Medicine

## 2016-01-12 ENCOUNTER — Other Ambulatory Visit (INDEPENDENT_AMBULATORY_CARE_PROVIDER_SITE_OTHER): Payer: BLUE CROSS/BLUE SHIELD

## 2016-01-12 ENCOUNTER — Ambulatory Visit (INDEPENDENT_AMBULATORY_CARE_PROVIDER_SITE_OTHER): Payer: BLUE CROSS/BLUE SHIELD | Admitting: Internal Medicine

## 2016-01-12 VITALS — BP 108/60 | HR 56 | Resp 20 | Wt 185.0 lb

## 2016-01-12 DIAGNOSIS — J309 Allergic rhinitis, unspecified: Secondary | ICD-10-CM | POA: Diagnosis not present

## 2016-01-12 DIAGNOSIS — Z0000 Encounter for general adult medical examination without abnormal findings: Secondary | ICD-10-CM

## 2016-01-12 DIAGNOSIS — Z0001 Encounter for general adult medical examination with abnormal findings: Secondary | ICD-10-CM | POA: Diagnosis not present

## 2016-01-12 DIAGNOSIS — R6889 Other general symptoms and signs: Secondary | ICD-10-CM

## 2016-01-12 DIAGNOSIS — Z202 Contact with and (suspected) exposure to infections with a predominantly sexual mode of transmission: Secondary | ICD-10-CM

## 2016-01-12 LAB — CBC WITH DIFFERENTIAL/PLATELET
Basophils Absolute: 0 10*3/uL (ref 0.0–0.1)
Basophils Relative: 0.2 % (ref 0.0–3.0)
Eosinophils Absolute: 0.1 10*3/uL (ref 0.0–0.7)
Eosinophils Relative: 0.7 % (ref 0.0–5.0)
HCT: 43.1 % (ref 39.0–52.0)
Hemoglobin: 14.6 g/dL (ref 13.0–17.0)
Lymphocytes Relative: 33.5 % (ref 12.0–46.0)
Lymphs Abs: 2.7 10*3/uL (ref 0.7–4.0)
MCHC: 34 g/dL (ref 30.0–36.0)
MCV: 88.2 fl (ref 78.0–100.0)
Monocytes Absolute: 0.5 10*3/uL (ref 0.1–1.0)
Monocytes Relative: 6.8 % (ref 3.0–12.0)
Neutro Abs: 4.7 10*3/uL (ref 1.4–7.7)
Neutrophils Relative %: 58.8 % (ref 43.0–77.0)
Platelets: 258 10*3/uL (ref 150.0–400.0)
RBC: 4.89 Mil/uL (ref 4.22–5.81)
RDW: 13.1 % (ref 11.5–15.5)
WBC: 8 10*3/uL (ref 4.0–10.5)

## 2016-01-12 LAB — URINALYSIS, ROUTINE W REFLEX MICROSCOPIC
Bilirubin Urine: NEGATIVE
HGB URINE DIPSTICK: NEGATIVE
Leukocytes, UA: NEGATIVE
Nitrite: NEGATIVE
RBC / HPF: NONE SEEN (ref 0–?)
Specific Gravity, Urine: 1.02 (ref 1.000–1.030)
TOTAL PROTEIN, URINE-UPE24: NEGATIVE
URINE GLUCOSE: NEGATIVE
UROBILINOGEN UA: 1 (ref 0.0–1.0)
pH: 6.5 (ref 5.0–8.0)

## 2016-01-12 MED ORDER — PREDNISONE 10 MG PO TABS: ORAL_TABLET | ORAL | Status: DC

## 2016-01-12 MED ORDER — AZELASTINE HCL 0.1 % NA SOLN: 2.0000 | Freq: Two times a day (BID) | NASAL | Status: DC

## 2016-01-12 NOTE — Assessment & Plan Note (Signed)
Ok for STD labs as ordered,  to f/u any worsening symptoms or concerns 

## 2016-01-12 NOTE — Patient Instructions (Signed)
Please take all new medication as prescribed  - the new nasal spray, and prednisone  Please continue all other medications as before, and refills have been done if requested  Please have the pharmacy call with any other refills you may need.  Please continue your efforts at being more active, low cholesterol diet, and weight control.  You are otherwise up to date with prevention measures today.  Please keep your appointments with your specialists as you may have planned  Please go to the LAB in the Basement (turn left off the elevator) for the tests to be done at your convenience  You will be contacted by phone if any changes need to be made immediately.  Otherwise, you will receive a letter about your results with an explanation, but please check with MyChart first.  Please remember to sign up for MyChart if you have not done so, as this will be important to you in the future with finding out test results, communicating by private email, and scheduling acute appointments online when needed.  Please return in 1 year for your yearly visit, or sooner if needed

## 2016-01-12 NOTE — Assessment & Plan Note (Signed)

## 2016-01-12 NOTE — Progress Notes (Signed)
Pre visit review using our clinic review tool, if applicable. No additional management support is needed unless otherwise documented below in the visit note. 

## 2016-01-12 NOTE — Progress Notes (Signed)
Subjective:    Patient ID: Blake Walsh, male    DOB: 02/24/1991, 25 y.o.   MRN: [id number]  HPI  Here for wellness and f/u;  Overall doing ok;  Pt denies Chest pain, worsening SOB, DOE, wheezing, orthopnea, PND, worsening LE edema, palpitations, dizziness or syncope.  Pt denies neurological change such as new headache, facial or extremity weakness.  Pt denies polydipsia, polyuria, or low sugar symptoms. Pt states overall good compliance with treatment and medications, good tolerability, and has been trying to follow appropriate diet.  Pt denies worsening depressive symptoms, suicidal ideation or panic. No fever, night sweats, wt loss, loss of appetite, or other constitutional symptoms.  Pt states good ability with ADL's, has low fall risk, home safety reviewed and adequate, no other significant changes in hearing or vision, and only occasionally active with exercise.  Does have several wks ongoing nasal allergy symptoms with clearish congestion, itch and sneezing, without fever, pain, ST, cough, swelling or wheezing. Asks for STD evaluation as has had several unprotected intercourse in the past yr.  Denies urinary symptoms such as dysuria, frequency, urgency, flank pain, hematuria or n/v, fever, chills.   Past Medical History  Diagnosis Date  . Allergic rhinitis   . ALLERGIC RHINITIS 01/29/2009  . CONJUNCTIVITIS, ALLERGIC 01/29/2009  . Kidney stone    Past Surgical History  Procedure Laterality Date  . None      reports that he has quit smoking. He started smoking about 3 years ago. He does not have any smokeless tobacco history on file. He reports that he does not drink alcohol or use illicit drugs. family history includes Hyperlipidemia in his father. No Known Allergies Current Outpatient Prescriptions on File Prior to Visit  Medication Sig Dispense Refill  . azelastine (OPTIVAR) 0.05 % ophthalmic solution Place 1 drop into both eyes 2 (two) times daily. 6 mL 12  . cetirizine (ZYRTEC) 10 MG  tablet Take 1 tablet (10 mg total) by mouth daily. 30 tablet 11  . cyclobenzaprine (FLEXERIL) 5 MG tablet Take one tablet by mouth three times daily as needed  for muscle spasm 60 tablet 6  . fluticasone (FLONASE) 50 MCG/ACT nasal spray Place 2 sprays into both nostrils daily. 16 g 5  . HYDROcodone-acetaminophen (NORCO/VICODIN) 5-325 MG per tablet Take 1 tablet by mouth every 6 (six) hours as needed for pain. 60 tablet 0  . naproxen (NAPROSYN) 500 MG tablet Take one tablet by mouth twice daily  with meals 60 tablet 11  . oxyCODONE-acetaminophen (PERCOCET) 10-325 MG per tablet Take 1-2 tablets by mouth every 4 (four) hours as needed for pain. 30 tablet 0  . TGT 12 HOUR NASAL DECONGESTANT 120 MG 12 hr tablet TAKE ONE TABLET BY MOUTH TWICE DAILY as needed FOR CONGESTION  20 tablet 6   No current facility-administered medications on file prior to visit.   Review of Systems Constitutional: Negative for increased diaphoresis, or other activity, appetite or siginficant weight change other than noted HENT: Negative for worsening hearing loss, ear pain, facial swelling, mouth sores and neck stiffness.   Eyes: Negative for other worsening pain, redness or visual disturbance.  Respiratory: Negative for choking or stridor Cardiovascular: Negative for other chest pain and palpitations.  Gastrointestinal: Negative for worsening diarrhea, blood in stool, or abdominal distention Genitourinary: Negative for hematuria, flank pain or change in urine volume.  Musculoskeletal: Negative for myalgias or other joint complaints.  Skin: Negative for other color change and wound or drainage.  Neurological: Negative for  syncope and numbness. other than noted Hematological: Negative for adenopathy. or other swelling Psychiatric/Behavioral: Negative for hallucinations, SI, self-injury, decreased concentration or other worsening agitation.      Objective:   Physical Exam BP 108/60 mmHg  Pulse 56  Resp 20  Wt 185 lb  (83.915 kg)  SpO2 97% VS noted,  Constitutional: Pt is oriented to person, place, and time. Appears well-developed and well-nourished, in no significant distress Head: Normocephalic and atraumatic  Eyes: Conjunctivae and EOM are normal. Pupils are equal, round, and reactive to light Right Ear: External ear normal.  Left Ear: External ear normal Nose: Nose normal.  Bilat tm's with mild erythema.  Max sinus areas non tender.  Pharynx with mild erythema, no exudate Mouth/Throat: Oropharynx is clear and moist  Neck: Normal range of motion. Neck supple. No JVD present. No tracheal deviation present or significant neck LA or mass Cardiovascular: Normal rate, regular rhythm, normal heart sounds and intact distal pulses.   Pulmonary/Chest: Effort normal and breath sounds without rales or wheezing  Abdominal: Soft. Bowel sounds are normal. NT. No HSM  Musculoskeletal: Normal range of motion. Exhibits no edema Lymphadenopathy: Has no cervical adenopathy.  Neurological: Pt is alert and oriented to person, place, and time. Pt has normal reflexes. No cranial nerve deficit. Motor grossly intact Skin: Skin is warm and dry. No rash noted or new ulcers Psychiatric:  Has normal mood and affect. Behavior is normal.     Assessment & Plan:

## 2016-01-12 NOTE — Assessment & Plan Note (Addendum)
Mild to mod seasonal flare, for predpac asd, astelin asd, to f/u any worsening symptoms or concerns  In addition to the time spent performing CPE, I spent an additional 15 minutes face to face,in which greater than 50% of this time was spent in counseling and coordination of care for patient's acute illness as documented.

## 2016-01-13 LAB — RPR

## 2016-01-13 LAB — BASIC METABOLIC PANEL
BUN: 15 mg/dL (ref 6–23)
CHLORIDE: 104 meq/L (ref 96–112)
CO2: 30 mEq/L (ref 19–32)
Calcium: 9.6 mg/dL (ref 8.4–10.5)
Creatinine, Ser: 1.05 mg/dL (ref 0.40–1.50)
GFR: 91.31 mL/min (ref >60.00)
GLUCOSE: 94 mg/dL (ref 70–99)
POTASSIUM: 4.3 meq/L (ref 3.5–5.1)
SODIUM: 139 meq/L (ref 135–145)

## 2016-01-13 LAB — HEPATITIS PANEL, ACUTE
HCV Ab: NEGATIVE
HEP B C IGM: NONREACTIVE
Hep A IgM: NONREACTIVE
Hepatitis B Surface Ag: NEGATIVE

## 2016-01-13 LAB — HEPATIC FUNCTION PANEL
ALBUMIN: 4.6 g/dL (ref 3.5–5.2)
ALT: 13 U/L (ref 0–53)
AST: 13 U/L (ref 0–37)
Alkaline Phosphatase: 57 U/L (ref 39–117)
Bilirubin, Direct: 0.2 mg/dL (ref 0.0–0.3)
Total Bilirubin: 0.9 mg/dL (ref 0.2–1.2)
Total Protein: 7.2 g/dL (ref 6.0–8.3)

## 2016-01-13 LAB — HIV ANTIBODY (ROUTINE TESTING W REFLEX): HIV 1&2 Ab, 4th Generation: NONREACTIVE

## 2016-01-13 LAB — HSV 2 ANTIBODY, IGG

## 2016-01-13 LAB — GC/CHLAMYDIA PROBE AMP
CT Probe RNA: NOT DETECTED
GC Probe RNA: NOT DETECTED

## 2016-01-13 LAB — LIPID PANEL
CHOLESTEROL: 141 mg/dL (ref 0–200)
HDL: 46.9 mg/dL (ref >39.00)
LDL Cholesterol: 83 mg/dL (ref 0–99)
NonHDL: 93.83
Total CHOL/HDL Ratio: 3
Triglycerides: 52 mg/dL (ref 0.0–149.0)
VLDL: 10.4 mg/dL (ref 0.0–40.0)

## 2016-01-13 LAB — TSH: TSH: 1.06 u[IU]/mL (ref 0.35–4.50)

## 2016-07-26 ENCOUNTER — Encounter: Payer: Self-pay | Admitting: Emergency Medicine

## 2016-07-26 ENCOUNTER — Emergency Department (INDEPENDENT_AMBULATORY_CARE_PROVIDER_SITE_OTHER)
Admission: EM | Admit: 2016-07-26 | Discharge: 2016-07-26 | Disposition: A | Discharge status: Home / Self Care | Payer: BLUE CROSS/BLUE SHIELD | Source: Home / Self Care | Attending: Family Medicine | Admitting: Family Medicine

## 2016-07-26 DIAGNOSIS — R42 Dizziness and giddiness: Secondary | ICD-10-CM

## 2016-07-26 DIAGNOSIS — H938X1 Other specified disorders of right ear: Secondary | ICD-10-CM

## 2016-07-26 DIAGNOSIS — R001 Bradycardia, unspecified: Secondary | ICD-10-CM

## 2016-07-26 DIAGNOSIS — R0981 Nasal congestion: Secondary | ICD-10-CM

## 2016-07-26 DIAGNOSIS — H6123 Impacted cerumen, bilateral: Secondary | ICD-10-CM

## 2016-07-26 MED ORDER — FLUTICASONE PROPIONATE 50 MCG/ACT NA SUSP: 2.0000 | Freq: Every day | NASAL | 2 refills | Status: DC

## 2016-07-26 MED ORDER — GUAIFENESIN ER 600 MG PO TB12: 600.0000 mg | ORAL_TABLET | Freq: Two times a day (BID) | ORAL | 0 refills | Status: DC

## 2016-07-26 MED ORDER — PREDNISONE 20 MG PO TABS: ORAL_TABLET | ORAL | 0 refills | Status: DC

## 2016-07-26 NOTE — ED Triage Notes (Signed)
Congestion, rt ear stopped up, sore throat, nausea, dizzy woke up with it this morning

## 2016-07-26 NOTE — ED Provider Notes (Signed)
CSN: [id number]     Arrival date & time 07/26/16  1735 History   First MD Initiated Contact with Patient 07/26/16 1806     Chief Complaint  Patient presents with  . URI   (Consider location/radiation/quality/duration/timing/severity/associated sxs/prior Treatment) HPI Blake Walsh is a 25 y.o. male presenting to UC with c/o sinus congestion, sore throat, right ear pain, and nausea that started this morning when he woke up.  He also reports mild intermittent cough.  Symptoms gradually worsened throughout the day.  He has not tried anything for his symptoms. Denies fever, chills, vomiting or diarrhea. Denies chest pain or SOB. No hx of asthma. Pt's friend in the room notes she has had mild congestion recently.  No recent travel.    Past Medical History:  Diagnosis Date  . Allergic rhinitis   . ALLERGIC RHINITIS 01/29/2009  . CONJUNCTIVITIS, ALLERGIC 01/29/2009  . Kidney stone    Past Surgical History:  Procedure Laterality Date  . none     Family History  Problem Relation Age of Onset  . Hyperlipidemia Father    Social History  Substance Use Topics  . Smoking status: Current Every Day Smoker    Packs/day: 0.50    Years: 10.00    Types: Cigarettes    Start date: 11/13/2012  . Smokeless tobacco: Never Used  . Alcohol use Yes    Review of Systems  Constitutional: Negative for chills and fever.  HENT: Positive for congestion, ear pain (Right), hearing loss (bilateral ear fullness), postnasal drip, sinus pressure and sore throat. Negative for trouble swallowing and voice change.   Respiratory: Positive for cough. Negative for shortness of breath.   Cardiovascular: Negative for chest pain and palpitations.  Gastrointestinal: Positive for nausea. Negative for abdominal pain, diarrhea and vomiting.  Musculoskeletal: Negative for arthralgias, back pain and myalgias.  Skin: Negative for rash.  Neurological: Positive for dizziness. Negative for light-headedness and headaches.     Allergies  Review of patient's allergies indicates no known allergies.  Home Medications   Prior to Admission medications   Medication Sig Start Date End Date Taking? Authorizing Provider  fluticasone (FLONASE) 50 MCG/ACT nasal spray Place 2 sprays into both nostrils daily. 07/26/16   Junius FinnerErin O'Malley, PA-C  guaiFENesin (MUCINEX) 600 MG 12 hr tablet Take 1 tablet (600 mg total) by mouth 2 (two) times daily. 07/26/16   Junius FinnerErin O'Malley, PA-C  oxyCODONE-acetaminophen (PERCOCET) 10-325 MG per tablet Take 1-2 tablets by mouth every 4 (four) hours as needed for pain. 07/15/13   Ihor GullyMark Ottelin, MD  predniSONE (DELTASONE) 10 MG tablet 3 tabs by mouth per day for 3 days,2tabs per day for 3 days,1tab per day for 3 days 01/12/16   Corwin LevinsJames W John, MD  predniSONE (DELTASONE) 20 MG tablet 3 tabs po day one, then 2 po daily x 4 days 07/26/16   Junius FinnerErin O'Malley, PA-C  TGT 12 HOUR NASAL DECONGESTANT 120 MG 12 hr tablet TAKE ONE TABLET BY MOUTH TWICE DAILY as needed FOR CONGESTION  08/07/13   Corwin LevinsJames W John, MD   Meds Ordered and Administered this Visit  Medications - No data to display  BP 117/69 (BP Location: Left Arm)   Pulse (!) 46   Temp 97.8 F (36.6 C) (Oral)   Ht 5\' 8"  (1.727 m)   Wt 190 lb (86.2 kg)   SpO2 99%   BMI 28.89 kg/m  No data found.   Physical Exam  Constitutional: He is oriented to person, place, and time. He appears well-developed  and well-nourished. No distress.  HENT:  Head: Normocephalic and atraumatic.  Right Ear: Tympanic membrane is not erythematous and not bulging. A middle ear effusion is present.  Left Ear: Tympanic membrane normal.  No middle ear effusion.  Nose: Mucosal edema present. Right sinus exhibits no maxillary sinus tenderness and no frontal sinus tenderness. Left sinus exhibits no maxillary sinus tenderness and no frontal sinus tenderness.  Mouth/Throat: Uvula is midline, oropharynx is clear and moist and mucous membranes are normal.  Bilateral cerumen impaction.   Right TM after removal of cerumen- clear small middle ear effusion. No erythema or bulging of TM Left TM after cerumen removal- normal  Eyes: Conjunctivae are normal. No scleral icterus.  Neck: Normal range of motion. Neck supple.  Cardiovascular: Regular rhythm and normal heart sounds.  Bradycardia present.   Pulmonary/Chest: Effort normal and breath sounds normal. No stridor. No respiratory distress. He has no wheezes. He has no rales.  Musculoskeletal: Normal range of motion.  Lymphadenopathy:    He has no cervical adenopathy.  Neurological: He is alert and oriented to person, place, and time.  Skin: Skin is warm and dry. He is not diaphoretic.  Nursing note and vitals reviewed.   Urgent Care Course   Clinical Course    Procedures (including critical care time)  Labs Review Labs Reviewed - No data to display  Imaging Review No results found.   MDM   1. Bilateral hearing loss due to cerumen impaction   2. Ear fullness, right   3. Nasal congestion   4. Dizziness   5. Bradycardia    Pt presenting to UC with 1 day of URI and sinus symptoms. Bilateral cerumen impaction noted on exam. Pt also c/o dizziness and Right ear pain. Pulse- 46 in triage, 51 on recheck. Pt denies chest pain or SOB. Reviewed medical records- pt had Pulse of 56 during routine f/u with his PCP on 01/12/16. Doubt HR is the source of his dizziness today. Dizziness likely due to the sinus congestion and cerumen impaction.  Rx: Flonase, prednisone, and guaifenesin  Encouraged f/u with PCP in 1 week if not improving, sooner if worsening. Discussed symptoms that warrant emergent care in the ED in regards to his slow HR as pt was unaware of his prior hx of slow HR.    Junius FinnerErin O'Malley, PA-C 07/26/16 1850

## 2016-08-09 ENCOUNTER — Encounter: Payer: Self-pay | Admitting: Internal Medicine

## 2016-08-09 ENCOUNTER — Ambulatory Visit (INDEPENDENT_AMBULATORY_CARE_PROVIDER_SITE_OTHER): Payer: BLUE CROSS/BLUE SHIELD | Admitting: Internal Medicine

## 2016-08-09 DIAGNOSIS — J029 Acute pharyngitis, unspecified: Secondary | ICD-10-CM | POA: Diagnosis not present

## 2016-08-09 MED ORDER — AZITHROMYCIN 250 MG PO TABS: ORAL_TABLET | ORAL | 1 refills | Status: DC

## 2016-08-09 MED ORDER — HYDROCODONE-HOMATROPINE 5-1.5 MG/5ML PO SYRP: 5.0000 mL | ORAL_SOLUTION | Freq: Four times a day (QID) | ORAL | 0 refills | Status: AC | PRN

## 2016-08-09 NOTE — Progress Notes (Signed)
   Subjective:    Patient ID: Blake Walsh, male    DOB: 05/11/1991, 25 y.o.   MRN: [id number]  HPI   Here with 2-3 days acute onset fever, facial pain, pressure, headache, general weakness and malaise, and greenish d/c, with mild ST and cough, but pt denies chest pain, wheezing, increased sob or doe, orthopnea, PND, increased LE swelling, palpitations, dizziness or syncope. Past Medical History:  Diagnosis Date  . Allergic rhinitis   . ALLERGIC RHINITIS 01/29/2009  . CONJUNCTIVITIS, ALLERGIC 01/29/2009  . Kidney stone    Past Surgical History:  Procedure Laterality Date  . none      reports that he has been smoking Cigarettes.  He started smoking about 3 years ago. He has a 5.00 pack-year smoking history. He has never used smokeless tobacco. He reports that he drinks alcohol. He reports that he does not use drugs. family history includes Hyperlipidemia in his father. No Known Allergies Current Outpatient Prescriptions on File Prior to Visit  Medication Sig Dispense Refill  . fluticasone (FLONASE) 50 MCG/ACT nasal spray Place 2 sprays into both nostrils daily. 9.9 g 2  . guaiFENesin (MUCINEX) 600 MG 12 hr tablet Take 1 tablet (600 mg total) by mouth 2 (two) times daily. 20 tablet 0  . oxyCODONE-acetaminophen (PERCOCET) 10-325 MG per tablet Take 1-2 tablets by mouth every 4 (four) hours as needed for pain. 30 tablet 0  . predniSONE (DELTASONE) 20 MG tablet 3 tabs po day one, then 2 po daily x 4 days 11 tablet 0  . TGT 12 HOUR NASAL DECONGESTANT 120 MG 12 hr tablet TAKE ONE TABLET BY MOUTH TWICE DAILY as needed FOR CONGESTION  20 tablet 6   No current facility-administered medications on file prior to visit.    Review of Systems All otherwise neg per pt     Objective:   Physical Exam BP 138/82   Pulse 64   Temp 98.4 F (36.9 C) (Oral)   Resp 20   Wt 189 lb (85.7 kg)   SpO2 98%   BMI 28.74 kg/m  VS noted, mild ill Constitutional: Pt appears in no apparent distress HENT: Head: NCAT.   Right Ear: External ear normal.  Left Ear: External ear normal.  Eyes: . Pupils are equal, round, and reactive to light. Conjunctivae and EOM are normal Bilat tm's with mild erythema.  Max sinus areas mild tender.  Pharynx with mild erythema, no exudate Neck: Normal range of motion. Neck supple.  Cardiovascular: Normal rate and regular rhythm.   Pulmonary/Chest: Effort normal and breath sounds without rales or wheezing.  Neurological: Pt is alert. Not confused , motor grossly intact Skin: Skin is warm. No rash, no LE edema Psychiatric: Pt behavior is normal. No agitation.    No other new exam findings Assessment & Plan:

## 2016-08-09 NOTE — Progress Notes (Signed)
Pre visit review using our clinic review tool, if applicable. No additional management support is needed unless otherwise documented below in the visit note. 

## 2016-08-09 NOTE — Patient Instructions (Signed)
Please take all new medication as prescribed - the antibiotic, and cough medicine if needed  Please continue all other medications as before, and refills have been done if requested.  Please have the pharmacy call with any other refills you may need.  Please keep your appointments with your specialists as you may have planned     

## 2016-08-14 NOTE — Assessment & Plan Note (Addendum)
With cough and sinus pain, Mild to mod, for antibx course, chloraseptic for pain, also advil/tylenol prn to f/u any worsening symptoms or concerns

## 2016-08-25 ENCOUNTER — Encounter: Payer: Self-pay | Admitting: Internal Medicine

## 2016-08-25 ENCOUNTER — Ambulatory Visit (INDEPENDENT_AMBULATORY_CARE_PROVIDER_SITE_OTHER): Payer: BLUE CROSS/BLUE SHIELD | Admitting: Internal Medicine

## 2016-08-25 VITALS — BP 122/78 | HR 66 | Temp 98.0°F | Resp 20 | Wt 190.0 lb

## 2016-08-25 DIAGNOSIS — R001 Bradycardia, unspecified: Secondary | ICD-10-CM

## 2016-08-25 NOTE — Patient Instructions (Signed)
Your EKG and letter were given to you  Please continue all other medications as before, and refills have been done if requested.  Please have the pharmacy call with any other refills you may need.  Please keep your appointments with your specialists as you may have planned

## 2016-08-25 NOTE — Progress Notes (Signed)
   Subjective:    Patient ID: Blake Walsh, male    DOB: 1991-02-05, 25 y.o.   MRN: [id number]  HPI  Here to f/u simply b/c during application for health insurance, he was noted to have bradycardia.  Pt denies chest pain, increased sob or doe, wheezing, orthopnea, PND, increased LE swelling, palpitations, dizziness or syncope. No prior hx, Needs letter regarding this to get health insurance more favorable rate Past Medical History:  Diagnosis Date  . Allergic rhinitis   . ALLERGIC RHINITIS 01/29/2009  . CONJUNCTIVITIS, ALLERGIC 01/29/2009  . Kidney stone    Past Surgical History:  Procedure Laterality Date  . none      reports that he has been smoking Cigarettes.  He started smoking about 3 years ago. He has a 5.00 pack-year smoking history. He has never used smokeless tobacco. He reports that he drinks alcohol. He reports that he does not use drugs. family history includes Hyperlipidemia in his father. No Known Allergies Current Outpatient Prescriptions on File Prior to Visit  Medication Sig Dispense Refill  . fluticasone (FLONASE) 50 MCG/ACT nasal spray Place 2 sprays into both nostrils daily. 9.9 g 2  . guaiFENesin (MUCINEX) 600 MG 12 hr tablet Take 1 tablet (600 mg total) by mouth 2 (two) times daily. 20 tablet 0  . oxyCODONE-acetaminophen (PERCOCET) 10-325 MG per tablet Take 1-2 tablets by mouth every 4 (four) hours as needed for pain. 30 tablet 0  . TGT 12 HOUR NASAL DECONGESTANT 120 MG 12 hr tablet TAKE ONE TABLET BY MOUTH TWICE DAILY as needed FOR CONGESTION  20 tablet 6   No current facility-administered medications on file prior to visit.    Review of Systems All otherwise neg per pt    Objective:   Physical Exam BP 122/78   Pulse 66   Temp 98 F (36.7 C) (Oral)   Resp 20   Wt 190 lb (86.2 kg)   SpO2 98%   BMI 28.89 kg/m  VS noted,  Constitutional: Pt appears in no apparent distress HENT: Head: NCAT.  Right Ear: External ear normal.  Left Ear: External ear normal.    Eyes: . Pupils are equal, round, and reactive to light. Conjunctivae and EOM are normal Neck: Normal range of motion. Neck supple.  Cardiovascular: Mild slow rate and regular rhythm.   Pulmonary/Chest: Effort normal and breath sounds without rales or wheezing.  Abd:  Soft, NT, ND, + BS Neurological: Pt is alert. Not confused , motor grossly intact Skin: Skin is warm. No rash, no LE edema Psychiatric: Pt behavior is normal. No agitation.   ECG I have personally interpreted Sinus  Bradycardia  - 50 WITHIN NORMAL LIMITS    Assessment & Plan:

## 2016-08-25 NOTE — Progress Notes (Signed)
Pre visit review using our clinic review tool, if applicable. No additional management support is needed unless otherwise documented below in the visit note. 

## 2016-08-27 DIAGNOSIS — R001 Bradycardia, unspecified: Secondary | ICD-10-CM

## 2016-08-27 HISTORY — DX: Bradycardia, unspecified: R00.1

## 2016-08-27 NOTE — Assessment & Plan Note (Signed)
C/w sinus bradycardia, pt asymptomatic, ecg reviewed as per emr, bradycardia is c/w Normal/benign for this pt, letter done per pt request

## 2019-01-08 ENCOUNTER — Ambulatory Visit: Payer: BLUE CROSS/BLUE SHIELD | Admitting: Physician Assistant

## 2019-01-10 ENCOUNTER — Telehealth: Payer: Self-pay | Admitting: Physician Assistant

## 2019-01-10 ENCOUNTER — Ambulatory Visit (INDEPENDENT_AMBULATORY_CARE_PROVIDER_SITE_OTHER): Payer: Managed Care, Other (non HMO) | Admitting: Physician Assistant

## 2019-01-10 ENCOUNTER — Encounter: Payer: Self-pay | Admitting: Physician Assistant

## 2019-01-10 ENCOUNTER — Other Ambulatory Visit: Payer: Self-pay

## 2019-01-10 VITALS — BP 132/71 | HR 50 | Temp 98.0°F | Ht 68.0 in | Wt 192.0 lb

## 2019-01-10 DIAGNOSIS — F1290 Cannabis use, unspecified, uncomplicated: Secondary | ICD-10-CM

## 2019-01-10 DIAGNOSIS — R45851 Suicidal ideations: Secondary | ICD-10-CM | POA: Diagnosis not present

## 2019-01-10 DIAGNOSIS — F17210 Nicotine dependence, cigarettes, uncomplicated: Secondary | ICD-10-CM

## 2019-01-10 DIAGNOSIS — Z800 Family history of malignant neoplasm of digestive organs: Secondary | ICD-10-CM

## 2019-01-10 DIAGNOSIS — F322 Major depressive disorder, single episode, severe without psychotic features: Secondary | ICD-10-CM

## 2019-01-10 DIAGNOSIS — R001 Bradycardia, unspecified: Secondary | ICD-10-CM | POA: Insufficient documentation

## 2019-01-10 NOTE — Patient Instructions (Signed)
Safety Plan: if having self-harm or suicidal thoughts Our Office [phone] [facility] Crisis Hotline [phone] 197 2092 National Suicide Hotline 1-800-SUICIDE  If in immediate danger of harming yourself, go to [facility] to [facility] [address], [place] or the nearest emergency room or call 911    Suicidal Feelings: How to Help Yourself Suicide is when you end your own life. There are many things you can do to help yourself feel better when struggling with these feelings. Many services and people are available to support you and others who struggle with similar feelings. If you ever feel like you may hurt yourself or others, or have thoughts about taking your own life, get help right away. To get help:  Call your local emergency services (911 in the [place]  Go to your nearest emergency department.  Call a suicide hotline to speak with a trained counselor. The following suicide hotlines are available in the [place]: ? 1-800-273-TALK 640-792-6187(1-4505956161). ? 1-800-SUICIDE 601-666-1385(1-313-248-0609). ? 856-530-19511-339-681-3975. This is a hotline for Spanish speakers. ? 56443848321-234-680-0889. This is a hotline for TTY users. ? 1-866-4-U-TREVOR 307 657 4775(1-403-648-9247). This is a hotline for lesbian, gay, bisexual, transgender, or questioning youth. ? For a list of hotlines in [place], visit ItCheaper.dkwww.suicide.org/hotlines/international/[place]-suicide-hotlines.html  Contact a crisis center or a local suicide prevention center. To find a crisis center or suicide prevention center: ? Call your local hospital, clinic, community service organization, mental health center, social service provider, or health department. Ask for help with connecting to a crisis center. ? For a list of crisis centers in the [place], visit: suicidepreventionlifeline.org ? For a list of crisis centers in [place], visit: suicideprevention.ca How to help yourself feel better   Promise yourself that you will not do anything extreme  when you have suicidal feelings. Remember, there is hope. Many people have gotten through suicidal thoughts and feelings, and you can too. If you have had these feelings before, remind yourself that you can get through them again.  Let family, friends, teachers, or counselors know how you are feeling. Try not to separate yourself from those who care about you and want to help you. Talk with someone every day, even if you do not feel sociable. Face-to-face conversation is best to help them understand your feelings.  Contact a mental health care provider and work with this person regularly.  Make a safety plan that you can follow during a crisis. Include phone numbers of suicide prevention hotlines, mental health professionals, and trusted friends and family members you can call during an emergency. Save these numbers on your phone.  If you are thinking of taking a lot of medicine, give your medicine to someone who can give it to you as prescribed. If you are on antidepressants and are concerned you will overdose, tell your health care provider so that he or she can give you safer medicines.  Try to stick to your routines. Follow a schedule every day. Make self-care a priority.  Make a list of realistic goals, and cross them off when you achieve them. Accomplishments can give you a sense of worth.  Wait until you are feeling better before doing things that you find difficult or unpleasant.  Do things that you have always enjoyed to take your mind off your feelings. Try reading a book, or listening to or playing music. Spending time outside, in nature, may help you feel better. Follow these instructions at home:   Visit your primary health care provider every year for a checkup.  Work with a mental health care provider as needed.  Eat a well-balanced diet, and eat regular meals.  Get plenty of rest.  Exercise if you are able. Just 30 minutes of exercise each day can help you feel better.   Take over-the-counter and prescription medicines only as told by your health care provider. Ask your mental health care provider about the possible side effects of any medicines you are taking.  Do not use alcohol or drugs, and remove these substances from your home.  Remove weapons, poisons, knives, and other deadly items from your home. General recommendations  Keep your living space well lit.  When you are feeling well, write yourself a letter with tips and support that you can read when you are not feeling well.  Remember that life's difficulties can be sorted out with help. Conditions can be treated, and you can learn behaviors and ways of thinking that will help you. Where to find more information  National Suicide Prevention Lifeline: www.suicidepreventionlifeline.org  Hopeline: www.hopeline.com  [employer] for Suicide Prevention: https://www.ayers.com/  [employer] (for lesbian, gay, bisexual, transgender, or questioning youth): www.thetrevorproject.org Contact a health care provider if:  You feel as though you are a burden to others.  You feel agitated, angry, vengeful, or have extreme mood swings.  You have withdrawn from family and friends. Get help right away if:  You are talking about suicide or wishing to die.  You start making plans for how to commit suicide.  You feel that you have no reason to live.  You start making plans for putting your affairs in order, saying goodbye, or giving your possessions away.  You feel guilt, shame, or unbearable pain, and it seems like there is no way out.  You are frequently using drugs or alcohol.  You are engaging in risky behaviors that could lead to death. If you have any of these symptoms, get help right away. Call emergency services, go to your nearest emergency department or crisis center, or call a suicide crisis helpline. Summary  Suicide is when you take your own life.  Promise yourself that you will not  do anything extreme when you have suicidal feelings.  Let family, friends, teachers, or counselors know how you are feeling.  Get help right away if you feel as though life is getting too tough to handle and you are thinking about suicide. This information is not intended to replace advice given to you by your health care provider. Make sure you discuss any questions you have with your health care provider. Document Released: 03/19/2003 Document Revised: 04/25/2017 Document Reviewed: 04/25/2017 Elsevier Interactive Patient Education  2019 Elsevier Inc.   Living With Depression Everyone experiences occasional disappointment, sadness, and loss in their lives. When you are feeling down, blue, or sad for at least 2 weeks in a row, it may mean that you have depression. Depression can affect your thoughts and feelings, relationships, daily activities, and physical health. It is caused by changes in the way your brain functions. If you receive a diagnosis of depression, your health care provider will tell you which type of depression you have and what treatment options are available to you. If you are living with depression, there are ways to help you recover from it and also ways to prevent it from coming back. How to cope with lifestyle changes Coping with stress     Stress is your body's reaction to life changes and events, both good and bad. Stressful situations may  include:  Getting married.  The death of a spouse.  Losing a job.  Retiring.  Having a baby. Stress can last just a few hours or it can be ongoing. Stress can play a major role in depression, so it is important to learn both how to cope with stress and how to think about it differently. Talk with your health care provider or a counselor if you would like to learn more about stress reduction. He or she may suggest some stress reduction techniques, such as:  Music therapy. This can include creating music or listening to music.  Choose music that you enjoy and that inspires you.  Mindfulness-based meditation. This kind of meditation can be done while sitting or walking. It involves being aware of your normal breaths, rather than trying to control your breathing.  Centering prayer. This is a kind of meditation that involves focusing on a spiritual word or phrase. Choose a word, phrase, or sacred image that is meaningful to you and that brings you peace.  Deep breathing. To do this, expand your stomach and inhale slowly through your nose. Hold your breath for 3-5 seconds, then exhale slowly, allowing your stomach muscles to relax.  Muscle relaxation. This involves intentionally tensing muscles then relaxing them. Choose a stress reduction technique that fits your lifestyle and personality. Stress reduction techniques take time and practice to develop. Set aside 5-15 minutes a day to do them. Therapists can offer training in these techniques. The training may be covered by some insurance plans. Other things you can do to manage stress include:  Keeping a stress diary. This can help you learn what triggers your stress and ways to control your response.  Understanding what your limits are and saying no to requests or events that lead to a schedule that is too full.  Thinking about how you respond to certain situations. You may not be able to control everything, but you can control how you react.  Adding humor to your life by watching funny films or TV shows.  Making time for activities that help you relax and not feeling guilty about spending your time this way.  Medicines Your health care provider may suggest certain medicines if he or she feels that they will help improve your condition. Avoid using alcohol and other substances that may prevent your medicines from working properly (may interact). It is also important to:  Talk with your pharmacist or health care provider about all the medicines that you take, their  possible side effects, and what medicines are safe to take together.  Make it your goal to take part in all treatment decisions (shared decision-making). This includes giving input on the side effects of medicines. It is best if shared decision-making with your health care provider is part of your total treatment plan. If your health care provider prescribes a medicine, you may not notice the full benefits of it for 4-8 weeks. Most people who are treated for depression need to be on medicine for at least 6-12 months after they feel better. If you are taking medicines as part of your treatment, do not stop taking medicines without first talking to your health care provider. You may need to have the medicine slowly decreased (tapered) over time to decrease the risk of harmful side effects. Relationships Your health care provider may suggest family therapy along with individual therapy and drug therapy. While there may not be family problems that are causing you to feel depressed, it is still important  to make sure your family learns as much as they can about your mental health. Having your family's support can help make your treatment successful. How to recognize changes in your condition Everyone has a different response to treatment for depression. Recovery from major depression happens when you have not had signs of major depression for two months. This may mean that you will start to:  Have more interest in doing activities.  Feel less hopeless than you did 2 months ago.  Have more energy.  Overeat less often, or have better or improving appetite.  Have better concentration. Your health care provider will work with you to decide the next steps in your recovery. It is also important to recognize when your condition is getting worse. Watch for these signs:  Having fatigue or low energy.  Eating too much or too little.  Sleeping too much or too little.  Feeling restless, agitated, or  hopeless.  Having trouble concentrating or making decisions.  Having unexplained physical complaints.  Feeling irritable, angry, or aggressive. Get help as soon as you or your family members notice these symptoms coming back. How to get support and help from others How to talk with friends and family members about your condition  Talking to friends and family members about your condition can provide you with one way to get support and guidance. Reach out to trusted friends or family members, explain your symptoms to them, and let them know that you are working with a health care provider to treat your depression. Financial resources Not all insurance plans cover mental health care, so it is important to check with your insurance carrier. If paying for co-pays or counseling services is a problem, search for a local or county mental health care center. They may be able to offer public mental health care services at low or no cost when you are not able to see a private health care provider. If you are taking medicine for depression, you may be able to get the generic form, which may be less expensive. Some makers of prescription medicines also offer help to patients who cannot afford the medicines they need. Follow these instructions at home:   Get the right amount and quality of sleep.  Cut down on using caffeine, tobacco, alcohol, and other potentially harmful substances.  Try to exercise, such as walking or lifting small weights.  Take over-the-counter and prescription medicines only as told by your health care provider.  Eat a healthy diet that includes plenty of vegetables, fruits, whole grains, low-fat dairy products, and lean protein. Do not eat a lot of foods that are high in solid fats, added sugars, or salt.  Keep all follow-up visits as told by your health care provider. This is important. Contact a health care provider if:  You stop taking your antidepressant medicines, and  you have any of these symptoms: ? Nausea. ? Headache. ? Feeling lightheaded. ? Chills and body aches. ? Not being able to sleep (insomnia).  You or your friends and family think your depression is getting worse. Get help right away if:  You have thoughts of hurting yourself or others. If you ever feel like you may hurt yourself or others, or have thoughts about taking your own life, get help right away. You can go to your nearest emergency department or call:  Your local emergency services (911 in the [place]  A suicide crisis helpline, such as the National Suicide Prevention Lifeline at (872)518-6309. This is open 24-hours  a day. Summary  If you are living with depression, there are ways to help you recover from it and also ways to prevent it from coming back.  Work with your health care team to create a management plan that includes counseling, stress management techniques, and healthy lifestyle habits. This information is not intended to replace advice given to you by your health care provider. Make sure you discuss any questions you have with your health care provider. Document Released: 08/15/2016 Document Revised: 08/15/2016 Document Reviewed: 08/15/2016 Elsevier Interactive Patient Education  2019 [employer].

## 2019-01-10 NOTE — Telephone Encounter (Signed)
I placed an urgent referral to [facility] for Partial Hospitalization Program I called [phone] and left a voicemail on the outpatient line asking for callback Arline Asp, please follow-up

## 2019-01-10 NOTE — Progress Notes (Signed)
HPI:                                                                Blake Walsh is a 28 y.o. male who presents to [facility] Blake Walsh: Primary Care Sports Medicine today to establish care  Current concerns: "mental health assessment"  Blake Walsh was referred here today by his marriage counselor Blake Walsh(Blake Walsh) for worsening depression with suicidal ideations.  According to both patient and his counselor he was contemplating taking his life with a gun on Sunday.  Patient states he heard his [id number]-month-old daughter laughing and decided not to end his life. Patient reports he has had suicidal thoughts for over a year and on a couple of occasions has formed a plan.  He denies any prior history of self-harm or suicide attempts. Patient describes difficulty with controlling his moods and emotion.  He states he will get into a simple disagreement with his wife, which will lead to anger, which will lead to feelings of hate toward his wife.  Which will then lead to self-hatred and suicidal thinking.  He states he has never had homicidal ideations and does not want to harm anyone which is why he thinks about harming himself.  Apart from marriage counseling, he has been in individual therapy for over 1 year.  He states that none of the coping skills he has learned have been helpful and he is at a loss for what else he can do.  No prior psychiatric hospitalizations No prior psychiatric medications  Family history Reports history of alcohol and substance abuse in paternal uncles Alcohol is him also in maternal grandfather Denies family history of bipolar disorder or mood disorder, but admits that his family would not be forthcoming about this information so he is really unsure Substance Tobacco: 1/2 pack/day Marijuana: Approximately 1 g/day Alcohol: Not currently, reports he has been sober for 9 months.  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  Does not feel that he has close friends or family whom he can confide in  Depression screen PHQ 2/9 01/10/2019  Decreased Interest 2  Down, Depressed, Hopeless 3  PHQ - 2 Score 5  Altered sleeping 2  Tired, decreased energy 3  Change in appetite 3  Feeling bad or failure about yourself  3  Trouble concentrating 2  Moving slowly or fidgety/restless 1  Suicidal thoughts 3  PHQ-9 Score 22    GAD 7 : Generalized Anxiety Score 01/10/2019  Nervous, Anxious, on Edge 3  Control/stop worrying 2  Worry too much - different things 2  Trouble relaxing 2  Restless 2  Easily annoyed or irritable 3  Afraid - awful might happen 3  Total GAD 7 Score 17      Past Medical History:  Diagnosis Date  . Allergic rhinitis   . ALLERGIC RHINITIS 01/29/2009  . CONJUNCTIVITIS,  ALLERGIC 01/29/2009  . Kidney stone    Past Surgical History:  Procedure Laterality Date  . none     Social History   Tobacco Use  . Smoking status: Current Every Day Smoker    Packs/day: 0.50    Years: 10.00    Pack years: 5.00    Types: Cigarettes    Start date: 11/13/2012  . Smokeless tobacco: Never Used  Substance Use Topics  . Alcohol use: Yes   family history includes Hyperlipidemia in his father.    Review of Systems  HENT: Positive for rhinorrhea.   Neurological: Positive for headaches.  Psychiatric/Behavioral: Positive for agitation, decreased concentration, dysphoric mood, sleep disturbance and suicidal ideas. Negative for hallucinations. The patient is nervous/anxious.   All other systems reviewed and are negative.    Medications: No  current outpatient medications on file.   No current facility-administered medications for this visit.    No Known Allergies     Objective:  BP 132/71   Pulse (!) 50   Temp 98 F (36.7 C) (Oral)   Ht 5\' 8"  (1.727 m)   Wt 192 lb (87.1 kg)   SpO2 97%   BMI 29.19 kg/m  Gen:  alert, not ill-appearing, no distress, appropriate for age HEENT: head normocephalic without obvious abnormality, conjunctiva and cornea clear, trachea midline Pulm: Normal work of breathing, normal phonation Neuro: alert and oriented x 3, no tremor MSK: extremities atraumatic, normal gait and station Skin: intact, no rashes on exposed skin, no jaundice, no cyanosis Psych: appearance casual, cooperative, downward gaze, depressed mood, affect mood-congruent, no psychomotor agitation, speech is articulate, thought processes clear and goal-directed, normal judgment, good insight, passive SI    No results found for this or any previous visit (from the past 72 hour(s)). No results found.    Assessment and Plan: 28 y.o. male with   .Diagnoses and all orders for this visit:  Current severe episode of major depressive disorder without psychotic features without prior episode (HCC) -     Ambulatory referral to Psychiatry  Suicidal ideations -     Ambulatory referral to Psychiatry  Marijuana smoker, continuous  Cigarette nicotine dependence without complication  Family history of colon cancer Comments: PGF  Sinus bradycardia    - Personally reviewed PMH, PSH, PFH, medications, allergies, HM - Age-appropriate cancer screening: n/a - Tdap UTD  Severe MDD with SI PHQ9=23 Blake Walsh endorses a history of chronic passive suicidal ideations that recently progressed to forming a plan this past Sunday.  Thankfully his therapist was able to contract him for safety and his wife removed the gun and ammunition from their home. Blake Walsh has good insight into his condition and verbally contracted for safety with me  today Based on his assessment I think he would do best in a partial hospitalization program with possible stepdown to intensive outpatient to include antidepressant medication His mood disorder questionnaire was negative and his history does not suggest a history of hypomania or bipolar disorder Referral placed to behavioral health urgently today Written safety plan reviewed with patient  Sinus bradycardia - present on ECG in 2017, pulse of 50 today c/w rate at that time, asymptomatic, normal TSH at time of initial work-up  Patient education and anticipatory guidance given Patient agrees with treatment plan Follow-up as needed if symptoms worsen or fail to improve  Levonne Hubert PA-C

## 2019-01-10 NOTE — Telephone Encounter (Signed)
Received callback from Whitney at [facility] She is reaching out to the patient today and scheduling him for intake tomorrow

## 2019-01-11 ENCOUNTER — Telehealth (HOSPITAL_COMMUNITY): Payer: Self-pay | Admitting: Professional

## 2019-01-11 ENCOUNTER — Other Ambulatory Visit (HOSPITAL_COMMUNITY): Payer: Managed Care, Other (non HMO) | Attending: Psychiatry | Admitting: Licensed Clinical Social Worker

## 2019-01-11 DIAGNOSIS — F332 Major depressive disorder, recurrent severe without psychotic features: Secondary | ICD-10-CM | POA: Diagnosis not present

## 2019-01-11 DIAGNOSIS — F12929 Cannabis use, unspecified with intoxication, unspecified: Secondary | ICD-10-CM | POA: Insufficient documentation

## 2019-01-11 DIAGNOSIS — F17210 Nicotine dependence, cigarettes, uncomplicated: Secondary | ICD-10-CM | POA: Diagnosis not present

## 2019-01-11 DIAGNOSIS — R45851 Suicidal ideations: Secondary | ICD-10-CM | POA: Diagnosis not present

## 2019-01-11 NOTE — Telephone Encounter (Signed)
Cln followed-up with pt per PCP referral. Cln spoke with pt about referral and oriented to group. Pt reports wanting to continue with assessment to join group. Pt shares he has had passive SI since July and recently started making plans. Pt reports having "ups and downs my whole life." Pt shares wife is involved in safety plan and has agreed to talk to wife, couples therapist Ali Lowe), and PCP Gena Fray) before acting. Pt reports feeling safe with his safety plan. Pt shares he had thoughts "for the first time" about his parents being better off dead last week and shared the thought with his wife. Pt shares he did not formulate a plan and denies wanting to formulate a plan/intent. Pt denies AVH. Pt states he smokes .5 - 1 gram of marijuana a day for about 5 years to "self-medicate." Cln educates pt on possible interaction between marijuana and medication and encourages pt to report this to provider if joins group. Pt agrees. Pt is scheduled for a virtual assessment on 4/17 @ 10a. Pt agrees to complete paperwork emailed to him and send back to cln prior to 8am tomorrow. Pt provides email of [email or web address]. Cln emailed paperwork immediately after call. Pt again denies any intent/plan to harm self or anyone else at end of call. Cln encourages pt to use safety plan if thoughts become overwhelming and confirmed pt has crisis hotline numbers as well. Pt agrees.

## 2019-01-14 ENCOUNTER — Other Ambulatory Visit: Payer: Self-pay

## 2019-01-14 ENCOUNTER — Other Ambulatory Visit (HOSPITAL_COMMUNITY): Payer: 59 | Attending: Psychiatry | Admitting: Licensed Clinical Social Worker

## 2019-01-14 ENCOUNTER — Other Ambulatory Visit (HOSPITAL_COMMUNITY): Payer: 59 | Admitting: Occupational Therapy

## 2019-01-14 DIAGNOSIS — F419 Anxiety disorder, unspecified: Secondary | ICD-10-CM | POA: Diagnosis not present

## 2019-01-14 DIAGNOSIS — F332 Major depressive disorder, recurrent severe without psychotic features: Secondary | ICD-10-CM

## 2019-01-14 DIAGNOSIS — R4587 Impulsiveness: Secondary | ICD-10-CM | POA: Insufficient documentation

## 2019-01-14 DIAGNOSIS — F17210 Nicotine dependence, cigarettes, uncomplicated: Secondary | ICD-10-CM | POA: Insufficient documentation

## 2019-01-14 DIAGNOSIS — Z79899 Other long term (current) drug therapy: Secondary | ICD-10-CM | POA: Insufficient documentation

## 2019-01-14 DIAGNOSIS — R454 Irritability and anger: Secondary | ICD-10-CM | POA: Insufficient documentation

## 2019-01-14 DIAGNOSIS — Z7289 Other problems related to lifestyle: Secondary | ICD-10-CM | POA: Diagnosis not present

## 2019-01-14 DIAGNOSIS — R4589 Other symptoms and signs involving emotional state: Secondary | ICD-10-CM | POA: Diagnosis not present

## 2019-01-14 DIAGNOSIS — Z915 Personal history of self-harm: Secondary | ICD-10-CM | POA: Insufficient documentation

## 2019-01-14 NOTE — Psych (Addendum)
Virtual Visit via Video Note  I connected with Blake Walsh on 01/11/19 at 10:00 AM EDT by a video enabled telemedicine application and verified that I am speaking with the correct person using two identifiers.   I discussed the limitations of evaluation and management by telemedicine and the availability of in person appointments. The patient expressed understanding and agreed to proceed.  I discussed the assessment and treatment plan with the patient. The patient was provided an opportunity to ask questions and all were answered. The patient agreed with the plan and demonstrated an understanding of the instructions.   The patient was advised to call back or seek an in-person evaluation if the symptoms worsen or if the condition fails to improve as anticipated.  I provided 90 minutes of non-face-to-face time during this encounter.   Blake Walsh, [facility], LCASA    Comprehensive Clinical Assessment (CCA) Note  01/14/2019 Blake Walsh [id number]  Visit Diagnosis:      ICD-10-CM   1. MDD (major depressive disorder), recurrent severe, without psychosis (HCC) F33.2       CCA Part One  Part One has been completed on paper by the patient.  (See scanned document in Chart Review)  CCA Part Two A  Intake/Chief Complaint:  CCA Intake With Chief Complaint CCA Part Two Date: 01/11/19 CCA Part Two Time: 1000 Chief Complaint/Presenting Problem: Pt presents to PHP per PCP due to "constant passive SI with starting to formulate a plan recently." Pt was able to safety plan with wife and PCP. PCP reports pt needs more monitoring for medications than she can provide. Pt endorses passive SI and denies plan/intent at time of CCA with this cln. Pt shares he had a plan to shoot himself last Sunday but was stopped when he heard his 5mo make noise. Pt reports he has had passive SI on and off over the last 10 years. Pt has been seeing an individual therapist Kenney Houseman([facility]) for about a year, but has not been seen  for about 2 months due to COVID-19 stay at home orders. Pt and wife see Kimberlee NearingSteven Foster for marriage counseling. Pt feels wife made an ultimatum that pt had to start individual counseling and marriage counseling or wife would leave. Pt reports being unsure if counseling is helping. Pt denies any previous treatment. Pt reports he's not happy with "how my marriage is going, my occupation, and I don't know how to move forward which causes me to display destructive behaviors, like spending a lot of money on material things." Pt reports he was furloughed from work 2.5 weeks ago and is now on unemployment. Pt worries about the financial strain this will cause family. Pt reports he understands he has misplaced anger towards others when he gets "worked up" about something and then internalizes that missed place anger which leads to SI. Pt reports he had some thoughts "of my parents being better off dead" last Saturday and shared that with his wife. Pt states he never had a plan or intent to act on the thought, "I think my life would be less stressful if they weren't here but I could never do anything like that to anyone." Pt reports a lack of supports in life. Pt shares he has feelings of hopelessness, worthlessness, anhedonia, weight loss, sleep issues, and concentration issues. Pt reports he self-medicates with marijuana daily. Pt denies AVH. Pt able to verbally safety plan with cln and agrees to sign ROI to talk to wife. Patients Currently Reported Symptoms/Problems: constant passive  SI- started forumalating plan recently; increased depression and anxiety; feelings of hopelessness and worthlessness; anhedonia; sleep issues; decreased concentration; weightloss; tearfulness; irritability; racing thoughts; anger Collateral Involvement: PCP, Gena Fray;  Individual's Strengths: some insight; loving father Type of Services Patient Feels Are Needed: Pt reports he is unsure of what services he needs but is willing to  try group at recommendation by Kimberlee Nearing, marriage counselor, and Gena Fray, [place]. Initial Clinical Notes/Concerns: Pt reports he self-medicates using marijuana daily. Cln discussed with pt he cannot use marijuana prior to or during group. Pt agrees and reports he will ask wife to keep marijuana during group hours so he is not tempted. Cln also discussed being honest with provider about marijuana use because it can interact with medications. Pt agreed. Pt verbally safety plans with cln, completed safety plan with Kimberlee Nearing and Gena Fray, too.   Mental Health Symptoms Depression:  Depression: Change in energy/activity, Difficulty Concentrating, Fatigue, Hopelessness, Increase/decrease in appetite, Irritability, Sleep (too much or little), Tearfulness, Weight gain/loss, Worthlessness  Mania:     Anxiety:   Anxiety: Difficulty concentrating, Fatigue, Irritability, Restlessness, Sleep, Tension, Worrying  Psychosis:     Trauma:     Obsessions:     Compulsions:     Inattention:     Hyperactivity/Impulsivity:     Oppositional/Defiant Behaviors:     Borderline Personality:  Emotional Irregularity: Mood lability  Other Mood/Personality Symptoms:      Mental Status Exam Appearance and self-care  Stature:  Stature: Average  Weight:  Weight: Average weight  Clothing:  Clothing: Casual  Grooming:  Grooming: Normal  Cosmetic use:  Cosmetic Use: None  Posture/gait:  Posture/Gait: Slumped  Motor activity:  Motor Activity: Not Remarkable  Sensorium  Attention:  Attention: Distractible  Concentration:  Concentration: Scattered  Orientation:  Orientation: X5  Recall/memory:  Recall/Memory: Normal  Affect and Mood  Affect:  Affect: Depressed, Anxious  Mood:  Mood: Depressed, Anxious  Relating  Eye contact:  Eye Contact: Fleeting  Facial expression:  Facial Expression: Anxious, Depressed  Attitude toward examiner:  Attitude Toward Examiner: Cooperative  Thought and Language   Speech flow: Speech Flow: Normal  Thought content:  Thought Content: Appropriate to mood and circumstances  Preoccupation:     Hallucinations:     Organization:     Company secretary of Knowledge:  Fund of Knowledge: Average  Intelligence:  Intelligence: Average  Abstraction:  Abstraction: Normal  Judgement:  Judgement: Poor  Reality Testing:  Reality Testing: Adequate  Insight:  Insight: Flashes of insight  Decision Making:  Decision Making: Vacilates  Social Functioning  Social Maturity:  Social Maturity: Self-centered  Social Judgement:  Social Judgement: Normal  Stress  Stressors:  Stressors: Family conflict, Transitions, Work, Scientist, forensic Ability:  Coping Ability: Deficient supports  Skill Deficits:     Supports:      Family and Psychosocial History: Family history Marital status: Married Number of Years Married: 3 What types of issues is patient dealing with in the relationship?: Pt reports feeling "trapped" in marriage; Pt feels wife made an ultimatum that pt had to start individual counseling and marriage counseling or wife would leave. Additional relationship information: Pt reports not being ready to become a father when wife became pregnant. Pt shares it was wife's decision to keep baby. Pt shares he loves daughter and would not give her up, but still doesn't feel ready for the responsibility. Pt shares him and wife have very different parenting styles which causes tension.  Are you sexually active?: Yes What is your sexual orientation?: heterosexual Has your sexual activity been affected by drugs, alcohol, medication, or emotional stress?: yes, decreased Does patient have children?: Yes How many children?: 1 How is patient's relationship with their children?: 70mo old daughter; "great relationship. She's my reason for still being here."  Childhood History:  Childhood History By whom was/is the patient raised?: Both parents Additional childhood history  information: Pt reports his parents were unhappy in their marriage and would not divorce "due to religious believes in my opinion." Pt shares his family was good at "faking it" and "made everyone believe we were the perfect family on the outside." Description of patient's relationship with caregiver when they were a child: Pt reports his mother was "too close and a helicopter parent. She made me uncomfortable at times with how much she smoothered me. She would sit on top of me and I didn't like that but didn't have the boundaries to tell her not to." Pt shares father was a Arts development officer and was very strict. Pt shares both parents shared "too much about their marriage with me." Pt reports he was spoiled by both parents "they were always buying me extravagent things but it was what they thought I wanted, not what I actually wanted. They never asked me. It's still like that today and now they're doing it to my daughter." Patient's description of current relationship with people who raised him/her: Pt reports he has a distant relationship with parents because "I don't want them to run my life like they want to" How were you disciplined when you got in trouble as a child/adolescent?: "It was always physical punishment and groundings" Does patient have siblings?: No Did patient suffer any verbal/emotional/physical/sexual abuse as a child?: No Did patient suffer from severe childhood neglect?: No Has patient ever been sexually abused/assaulted/raped as an adolescent or adult?: No Was the patient ever a victim of a crime or a disaster?: No Witnessed domestic violence?: No Has patient been effected by domestic violence as an adult?: No  CCA Part Two B  Employment/Work Situation: Employment / Work Psychologist, occupational Employment situation: Leave of absence(pt currently furloughed due to COVID-19) Patient's job has been impacted by current illness: Yes Describe how patient's job has been impacted: Pt reports decline in work  performance Did You Receive Any Psychiatric Treatment/Services While in Equities trader?: No Are There Guns or Other Weapons in Your Home?: Yes Types of Guns/Weapons: 1 handgun Are These Comptroller?: Yes(Pt reports the gun was in his closet but his wife has now taken the gun and ammo "and I don't know where it is now." Pt's PCP reported same. Pt agrees to sign ROI for wife.)  Education: Education Did Garment/textile technologist From [employer]?: Yes Did Theme park manager?: Yes Did You Attend Graduate School?: No Did You Have An Individualized Education Program (IIEP): No Did You Have Any Difficulty At School?: Yes Were Any Medications Ever Prescribed For These Difficulties?: No  Religion: Religion/Spirituality Are You A Religious Person?: No  Leisure/Recreation: Leisure / Recreation Leisure and Hobbies: "shows, games, weed, social gatherings with friends"  Exercise/Diet: Exercise/Diet Do You Exercise?: Yes What Type of Exercise Do You Do?: Weight Training(cannot exercise like he would like due to gym being closed ) How Many Times a Week Do You Exercise?: 4-5 times a week Have You Gained or Lost A Significant Amount of Weight in the Past Six Months?: Yes-Lost Number of Pounds Lost?: 15 Do You Follow a Special  Diet?: No(Pt reports being on low carb diet "sometimes") Do You Have Any Trouble Sleeping?: Yes Explanation of Sleeping Difficulties: Pt reports decreased hours of sleep and trouble falling/staying asleep   CCA Part Two C  Alcohol/Drug Use: Alcohol / Drug Use Pain Medications: see MAR Prescriptions: see MAR Over the Counter: see MAR History of alcohol / drug use?: Yes Substance #1 Name of Substance 1: nicotine 1 - Age of First Use: 19 1 - Amount (size/oz): 8-10 cigs 1 - Frequency: daily 1 - Duration: 9 years 1 - Last Use / Amount: earlier today Substance #2 Name of Substance 2: Marijuana 2 - Age of First Use: 19 2 - Amount (size/oz): currently: 1 gram daily 2 -  Frequency: daily 2 - Duration: 1 mo for current amount 2 - Last Use / Amount: earlier today                  CCA Part Three  ASAM's:  Six Dimensions of Multidimensional Assessment  Dimension 1:  Acute Intoxication and/or Withdrawal Potential:     Dimension 2:  Biomedical Conditions and Complications:     Dimension 3:  Emotional, Behavioral, or Cognitive Conditions and Complications:     Dimension 4:  Readiness to Change:     Dimension 5:  Relapse, Continued use, or Continued Problem Potential:     Dimension 6:  Recovery/Living Environment:      Substance use Disorder (SUD)    Social Function:  Social Functioning Social Maturity: Self-centered Social Judgement: Normal  Stress:  Stress Stressors: Family conflict, Transitions, Work, Dance movement psychotherapist Ability: Deficient supports Patient Takes Medications The Way The Doctor Instructed?: Yes Priority Risk: Moderate Risk  Risk Assessment- Self-Harm Potential: Risk Assessment For Self-Harm Potential Thoughts of Self-Harm: Vague current thoughts Method: No plan Availability of Means: No access/NA Additional Comments for Self-Harm Potential: Pt denies any previous attempts. Pt reports having chronic passive SI since July 2019. Pt shares he started formulating a plan to shoot self last Sunday and was interupted by 69mo old daughter making noise. Pt shares wife is in control of gun and ammo at this point. Pt denies plan/intent during CCA with this cln.  Risk Assessment -Dangerous to Others Potential: Risk Assessment For Dangerous to Others Potential Method: No Plan  DSM5 Diagnoses: Patient Active Problem List   Diagnosis Date Noted  . Current severe episode of major depressive disorder without psychotic features without prior episode (HCC) 01/10/2019  . Suicidal ideations 01/10/2019  . Marijuana smoker, continuous 01/10/2019  . Sinus bradycardia 01/10/2019  . Family history of colon cancer 01/10/2019  . Cigarette nicotine  dependence without complication 01/10/2019  . Bradycardia 08/27/2016  . Ureteral calculus, left 07/14/2013  . Microhematuria 06/19/2013  . Encounter for preventative adult health care exam with abnormal findings 07/10/2011  . Allergic conjunctivitis 01/29/2009  . Allergic rhinitis 01/29/2009    Patient Centered Plan: Patient is on the following Treatment Plan(s):  Depression  Recommendations for Services/Supports/Treatments: Recommendations for Services/Supports/Treatments Recommendations For Services/Supports/Treatments: Partial Hospitalization(Pt was referred to [facility] per PCP, Gena Fray, and Kimberlee Nearing, marriage counselor, supported referral. Pt reports chronic passive SIwith developing plan on 01/06/19; plan aborted due to daughter. Pt lacks support and coping skills)  Treatment Plan Summary:  Pt states "I feel cornered and trapped. I don't know another way out sometimes. This is the worst it's ever been. I don't want to feel like this any more"  Referrals to Alternative Service(s): Referred to Alternative Service(s):   Place:   Date:  Time:    Referred to Alternative Service(s):   Place:   Date:   Time:    Referred to Alternative Service(s):   Place:   Date:   Time:    Referred to Alternative Service(s):   Place:   Date:   Time:     Royetta Crochet, LCMHCA, LCASA

## 2019-01-14 NOTE — Psych (Signed)
Virtual Visit via Video Note  I connected with Blake Walsh on 01/14/19 at  9:00 AM EDT by a video enabled telemedicine application and verified that I am speaking with the correct person using two identifiers.   I discussed the limitations of evaluation and management by telemedicine and the availability of in person appointments. The patient expressed understanding and agreed to proceed.  I discussed the assessment and treatment plan with the patient. The patient was provided an opportunity to ask questions and all were answered. The patient agreed with the plan and demonstrated an understanding of the instructions.   The patient was advised to call back or seek an in-person evaluation if the symptoms worsen or if the condition fails to improve as anticipated.  I provided 15 minutes of non-face-to-face time during this encounter.   Pt and cln discussed treatment plan and goals. Pt verbally agrees to treatment plan.   Annastasia Haskins Lauro Franklin, LCMHCA, LCASA

## 2019-01-15 ENCOUNTER — Encounter (HOSPITAL_COMMUNITY): Payer: Self-pay

## 2019-01-15 ENCOUNTER — Encounter (HOSPITAL_COMMUNITY): Payer: Self-pay | Admitting: Occupational Therapy

## 2019-01-15 ENCOUNTER — Other Ambulatory Visit (HOSPITAL_COMMUNITY): Payer: 59 | Admitting: Occupational Therapy

## 2019-01-15 ENCOUNTER — Other Ambulatory Visit (HOSPITAL_COMMUNITY): Payer: 59 | Admitting: Licensed Clinical Social Worker

## 2019-01-15 ENCOUNTER — Other Ambulatory Visit: Payer: Self-pay

## 2019-01-15 VITALS — Ht 68.0 in | Wt 190.0 lb

## 2019-01-15 DIAGNOSIS — R4589 Other symptoms and signs involving emotional state: Secondary | ICD-10-CM

## 2019-01-15 DIAGNOSIS — F320 Major depressive disorder, single episode, mild: Secondary | ICD-10-CM

## 2019-01-15 DIAGNOSIS — F332 Major depressive disorder, recurrent severe without psychotic features: Secondary | ICD-10-CM

## 2019-01-15 MED ORDER — HYDROXYZINE PAMOATE 25 MG PO CAPS: 25.0000 mg | ORAL_CAPSULE | Freq: Three times a day (TID) | ORAL | 0 refills | Status: DC | PRN

## 2019-01-15 MED ORDER — SERTRALINE HCL 25 MG PO TABS: ORAL_TABLET | ORAL | 0 refills | Status: DC

## 2019-01-15 MED ORDER — TRAZODONE HCL 50 MG PO TABS: 50.0000 mg | ORAL_TABLET | Freq: Every day | ORAL | 0 refills | Status: DC

## 2019-01-15 NOTE — Therapy (Signed)
[facility] PARTIAL HOSPITALIZATION PROGRAM [address] [place], [place], [address] Phone: [phone]   Fax:  [phone]  Occupational Therapy Treatment  Patient Details  Name: Blake Walsh MRN: [id number] Date of Birth: Nov 23, 1990 Referring Provider (OT): [name], NP  Virtual Visit via Video Note  I connected with Blake Walsh on 01/15/19 at  8:00 AM EDT by a video enabled telemedicine application and verified that I am speaking with the correct person using two identifiers.   I discussed the limitations of evaluation and management by telemedicine and the availability of in person appointments. The patient expressed understanding and agreed to proceed.   I discussed the assessment and treatment plan with the patient. The patient was provided an opportunity to ask questions and all were answered. The patient agreed with the plan and demonstrated an understanding of the instructions.   The patient was advised to call back or seek an in-person evaluation if the symptoms worsen or if the condition fails to improve as anticipated.  I provided 60 minutes of non-face-to-face time during this encounter.  [name], MSOT, OTR/L Behavioral Health OT/ Acute Relief OT PHP Office: [phone]  [name], [place]    Encounter Date: 01/15/2019  OT End of Session - 01/15/19 1713    Visit Number  2    Number of Visits  16    Date for OT Re-Evaluation  02/12/19    Authorization Type  [employer]    OT Start Time  1100    OT Stop Time  1200    OT Time Calculation (min)  60 min    Activity Tolerance  Patient tolerated treatment well    Behavior During Therapy  [facility] for tasks assessed/performed       Past Medical History:  Diagnosis Date  . Allergic rhinitis   . Anxiety   . Bradycardia 08/27/2016  . Depression   . Kidney stone   . Ureteral calculus, left 07/14/2013    Past Surgical History:  Procedure Laterality Date  . NO PAST SURGERIES      There  were no vitals filed for this visit.  Subjective Assessment - 01/15/19 1713    Currently in Pain?  No/denies        S: "My goals are mostly centered around work and life balance"  O: Education given on self-accountability being in line with personal values and goals to maintain occupational balance in various community settings. Pt given goal identifying worksheet to list immediate, short term, medium term, and long-term goals using a SMART goal framework (specificity, meaningful, adaptive, realistic, and time bound). Goals created as guideline for pt to practice being accountable in various situations. Pt completed work sheet of goals and encouraged to share goals with the group, with emphasis on immediate goal for check in with pt for next session to maintain accountability.   A: Pt presents with flat affect, engaged and participatory throughout entirety of session. Pt shares how he needs to improve his prioritization with his life obligations. His goals reflected improvement in work and the daily obligations he mentioned. He specifically shares how him and his wife have a chore chart in which he has been not completing to his full potential. He also shares how he would like to improve his eating because he has been eating in a very unhealthy manner as of late.  P: OT will be x5 per week while pt in PHP.  OT Education - 01/15/19 1713    Education Details  education given on goals/values    Person(s) Educated  Patient    Methods  Explanation;Handout    Comprehension  Verbalized understanding       OT Short Term Goals - 01/15/19 1714      OT SHORT TERM GOAL #1   Title  Pt will be educated on strategies to improve psychosocial skills needed to participate fully in all daily, work, and leisure activities    Time  4    Period  Weeks    Status  On-going    Target Date  02/12/19      OT SHORT TERM GOAL #2   Title  Pt will apply psychosocial skills and  coping mechanisms to daily activities in order to function independently and reintegrate into community    Time  4    Period  Weeks    Status  On-going    Target Date  02/12/19      OT SHORT TERM GOAL #3   Title  Pt will recall and/or apply 1-3 sleep hygiene strategies to improve function in BADL routine upon reintegrating into community    Time  4    Period  Weeks    Status  On-going    Target Date  02/12/19      OT SHORT TERM GOAL #4   Title  Pt will engage in goal setting to improve functional BADL/IADL routine upon reintegrating into community    Time  4    Period  Weeks    Status  On-going    Target Date  02/12/19               Plan - 01/15/19 1714    Occupational performance deficits (Please refer to evaluation for details):  ADL's;IADL's;Rest and Sleep;Work;Leisure;Social Participation    Psychosocial Skills  Coping Strategies;Habits;Routines and Behaviors;Environmental  Adaptations;Interpersonal Interaction       Patient will benefit from skilled therapeutic intervention in order to improve the following deficits and impairments:  Psychosocial Skills  Visit Diagnosis: MDD (major depressive disorder), recurrent severe, without psychosis (HCC)  Difficulty coping    Problem List Patient Active Problem List   Diagnosis Date Noted  . Current severe episode of major depressive disorder without psychotic features without prior episode (HCC) 01/10/2019  . Suicidal ideations 01/10/2019  . Marijuana smoker, continuous 01/10/2019  . Sinus bradycardia 01/10/2019  . Family history of colon cancer 01/10/2019  . Cigarette nicotine dependence without complication 01/10/2019  . Bradycardia 08/27/2016  . Ureteral calculus, left 07/14/2013  . Microhematuria 06/19/2013  . Encounter for preventative adult health care exam with abnormal findings 07/10/2011  . Allergic conjunctivitis 01/29/2009  . Allergic rhinitis 01/29/2009   Dalphine HandingKaylee Lacara Dunsworth, MSOT, OTR/L Behavioral  Health OT/ Acute Relief OT PHP Office: [phone]  Dalphine HandingKaylee Joei Frangos 01/15/2019, 5:16 PM  [facility] PARTIAL HOSPITAL[facility]ATION PROGRAM [address] [place], [place], [address] Phone: [phone]   Fax:  [phone]  Name: Blake Walsh MRN: [id number] Date of Birth: 1991-06-20
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
## 2019-01-15 NOTE — Therapy (Addendum)
[facility] PARTIAL HOSPITALIZATION PROGRAM [address] [place], [place], [address] Phone: [phone]   Fax:  [phone]  Occupational Therapy Evaluation  Patient Details  Name: Blake Walsh MRN: [id number] Date of Birth: August 13, 1991 Referring Provider (OT): [name], NP  Virtual Visit via Video Note  I connected with Blake Walsh on 01/15/19 at  8:00 AM EDT by a video enabled telemedicine application and verified that I am speaking with the correct person using two identifiers.   I discussed the limitations of evaluation and management by telemedicine and the availability of in person appointments. The patient expressed understanding and agreed to proceed.   I discussed the assessment and treatment plan with the patient. The patient was provided an opportunity to ask questions and all were answered. The patient agreed with the plan and demonstrated an understanding of the instructions.   The patient was advised to call back or seek an in-person evaluation if the symptoms worsen or if the condition fails to improve as anticipated.  I provided 60 minutes of non-face-to-face time during this encounter.  Blake Walsh, MSOT, OTR/L Behavioral Health OT/ Acute Relief OT PHP Office: [phone]  Blake Walsh, [place]    Encounter Date: 01/14/2019  OT End of Session - 01/15/19 1240    Visit Number  1    Number of Visits  16    Date for OT Re-Evaluation  02/12/19    Authorization Type  [employer]    OT Start Time  1030    OT Stop Time  1200    OT Time Calculation (min)  90 min    Activity Tolerance  Patient tolerated treatment well    Behavior During Therapy  [facility] for tasks assessed/performed       Past Medical History:  Diagnosis Date  . Allergic rhinitis   . Anxiety   . Bradycardia 08/27/2016  . Depression   . Kidney stone   . Ureteral calculus, left 07/14/2013    Past Surgical History:  Procedure Laterality Date  . NO PAST SURGERIES      There  were no vitals filed for this visit.  Subjective Assessment - 01/15/19 1237    Currently in Pain?  No/denies         [facility] OT Assessment - 01/15/19 0001      Assessment   Medical Diagnosis  Major Depressive disorder, recurrent severe, without psychosis    Referring Provider (OT)  [name], NP    Onset Date/Surgical Date  01/14/19      Precautions   Precautions  None      Balance Screen   Has the patient fallen in the past 6 months  No    Has the patient had a decrease in activity level because of a fear of falling?   No    Is the patient reluctant to leave their home because of a fear of falling?   No         OT assessment: OCAIRS  Diagnosis: MDD, recurrent severe without psychotic features  Past medical history/referral information: Pt presents as referral from [place] for increase in mental health symptoms and history of chronic suicidality. Pt presents to eval with flat affect, but overall pleasant and motivated for change through PHP. Pt also uses marijuana, but plans to quit due to medication side effects.  Living situation: Pt lives with wife and 45 month old daughter  ADLs/IADLS: Pt reports a decreased sense of motivation in this area, he states his personal  hygiene will decrease the more depressed he is. He also shares that his "adult duties" such as homemaking and financial management become less frequent when he is depressed. He also shares how his appetite is sporadic and he is currently only eating junk food, which bothers him and is something he would like to change.  Work: Pt works in [employer], but is currently furloughed due to the COVID-10 pandemic. He does not currently like his job and is considering a career change  Leisure: He mentions video games and socialization  Social support: Pt identifies wife and some family  Struggles: sleep, time Insurance account manager, goal setting  OCAIRS Mental Health Interview Summary of Client Scores:  FACILITATES  PARTICIPATION IN OCCUPATION  ALLOWS PARTICIPATION IN OCCUPATION INHIBITS PARTICIPATION IN OCCUPATION RESTRICTS PARTICIPATION IN OCCUPATION COMMENTS  ROLES               X    HABITS               X    PERSONAL CAUSATION                            X Reports a loss in sense of self  VALUES               X    INTERESTS              X     SKILLS               X    SHORT TERM GOALS               X    LONG TERM GOALS               X    INTERPETATION OF PAST EXPERIENCES               X    PHYSICAL ENVIRONMENT               X  Difficulty with home maintaining  SOCIAL ENVIRONMENT               X    READINESS FOR CHANGE               X      Need for Occupational Therapy:  4 Shows positive occupational participation, no need for OT.   3 Need for minimal intervention/consultative participation    X 2 Need for OT intervention indicated to restore/improve participation   1 Need for extensive OT intervention indicated to improve participation.  Referral for follow up services also recommended.    Assessment:  Patient demonstrates behavior that inhibits participation in occupation.  Patient will benefit from occupational therapy intervention in order to improve time management, financial management, stress management, job readiness skills, social skills, and health management skills in preparation to return to full time community living and to be a productive community member.    Plan:  Patient will participate in skilled occupational therapy sessions individually or in a group setting to improve coping skills, psychosocial skills, and emotional skills required to return to prior level of function.  Treatment will be 5 times per week for 4 weeks.     OT TREATMENT: SLEEP HYGIENE   S: "I have only been sleeping 3-4 hours"   O: Education given on sleep hygiene to improve functional engagement in BADL routine. Pt asked current hours of sleep  and what sleep strategies used currently, if any. Handout given  on sleep hygiene strategies for pt to review. Pt asked to choose 1-3 new sleep hygiene strategies to implement for improved BADL engagement.  A: Pt presents with flat affect, engaged and participatory throughout entirety of session. Pt shares that 3-4 hours is his sleep norm, which leaves him feeling tired constantly. He shares how he also gets little sleep due to his 5210 month old daughter, and raising her. He shares that maintaining a sleep routine will be most helpful for him.  P: OT group will be x5 per week, antiicipate pt d/c on 01/16/2019.            OT Education - 01/15/19 1238    Education Details  education given on sleep hygiene    Person(s) Educated  Patient    Methods  Explanation;Handout    Comprehension  Verbalized understanding       OT Short Term Goals - 01/15/19 1247      OT SHORT TERM GOAL #1   Title  Pt will be educated on strategies to improve psychosocial skills needed to participate fully in all daily, work, and leisure activities    Time  4    Period  Weeks    Status  New    Target Date  02/12/19      OT SHORT TERM GOAL #2   Title  Pt will apply psychosocial skills and coping mechanisms to daily activities in order to function independently and reintegrate into community    Time  4    Period  Weeks    Status  New    Target Date  02/12/19      OT SHORT TERM GOAL #3   Title  Pt will recall and/or apply 1-3 sleep hygiene strategies to improve function in BADL routine upon reintegrating into community    Time  4    Period  Weeks    Status  New    Target Date  02/12/19      OT SHORT TERM GOAL #4   Title  Pt will engage in goal setting to improve functional BADL/IADL routine upon reintegrating into community    Time  4    Period  Weeks    Status  New    Target Date  02/12/19               Plan - 01/15/19 1240    OT Occupational Profile and History  Detailed Assessment- Review of Records and additional review of physical, cognitive,  psychosocial history related to current functional performance    Occupational performance deficits (Please refer to evaluation for details):  ADL's;IADL's;Rest and Sleep;Work;Leisure;Social Participation    Psychosocial Skills  Coping Strategies;Habits;Routines and Behaviors;Environmental  Adaptations;Interpersonal Interaction    Rehab Potential  Good    Clinical Decision Making  Limited treatment options, no task modification necessary    Comorbidities Affecting Occupational Performance:  None    Modification or Assistance to Complete Evaluation   No modification of tasks or assist necessary to complete eval    OT Frequency  5x / week    OT Duration  4 weeks    OT Treatment/Interventions  Coping strategies training;Psychosocial skills training;Self-care/ADL training   community reintegration   Consulted and Agree with Plan of Care  Patient       Patient will benefit from skilled therapeutic intervention in order to improve the following deficits and impairments:  Psychosocial Skills  Visit Diagnosis: MDD (major  depressive disorder), recurrent severe, without psychosis (HCC)  Difficulty coping    Problem List Patient Active Problem List   Diagnosis Date Noted  . Current severe episode of major depressive disorder without psychotic features without prior episode (HCC) 01/10/2019  . Suicidal ideations 01/10/2019  . Marijuana smoker, continuous 01/10/2019  . Sinus bradycardia 01/10/2019  . Family history of colon cancer 01/10/2019  . Cigarette nicotine dependence without complication 01/10/2019  . Bradycardia 08/27/2016  . Ureteral calculus, left 07/14/2013  . Microhematuria 06/19/2013  . Encounter for preventative adult health care exam with abnormal findings 07/10/2011  . Allergic conjunctivitis 01/29/2009  . Allergic rhinitis 01/29/2009   Blake Walsh, MSOT, OTR/L Behavioral Health OT/ Acute Relief OT PHP Office: [phone]  Blake Walsh 01/15/2019, 12:52 PM  [facility] PARTIAL HOSPITALIZATION PROGRAM [address] [place], [place], [address] Phone: [phone]   Fax:  [phone]  Name: Naod Hinojos MRN: [id number] Date of Birth: Sep 15, 1991
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
## 2019-01-16 ENCOUNTER — Other Ambulatory Visit (HOSPITAL_COMMUNITY): Payer: 59 | Admitting: Occupational Therapy

## 2019-01-16 ENCOUNTER — Encounter (HOSPITAL_COMMUNITY): Payer: Self-pay

## 2019-01-16 ENCOUNTER — Encounter (HOSPITAL_COMMUNITY): Payer: Self-pay | Admitting: Family

## 2019-01-16 ENCOUNTER — Other Ambulatory Visit (HOSPITAL_COMMUNITY): Payer: 59 | Admitting: Licensed Clinical Social Worker

## 2019-01-16 DIAGNOSIS — F332 Major depressive disorder, recurrent severe without psychotic features: Secondary | ICD-10-CM

## 2019-01-16 DIAGNOSIS — R4589 Other symptoms and signs involving emotional state: Secondary | ICD-10-CM

## 2019-01-16 NOTE — Therapy (Signed)
[facility] PARTIAL HOSPITALIZATION PROGRAM [address] [place], [place], [address] Phone: [phone]   Fax:  [phone]  Occupational Therapy Treatment  Patient Details  Name: Blake Walsh MRN: [id number] Date of Birth: 1990-10-02 Referring Provider (OT): [name], NP  Virtual Visit via Video Note  I connected with Blake Walsh on 01/16/19 at  8:00 AM EDT by a video enabled telemedicine application and verified that I am speaking with the correct person using two identifiers.   I discussed the limitations of evaluation and management by telemedicine and the availability of in person appointments. The patient expressed understanding and agreed to proceed.   I discussed the assessment and treatment plan with the patient. The patient was provided an opportunity to ask questions and all were answered. The patient agreed with the plan and demonstrated an understanding of the instructions.   The patient was advised to call back or seek an in-person evaluation if the symptoms worsen or if the condition fails to improve as anticipated.  I provided 60 minutes of non-face-to-face time during this encounter.  [name], MSOT, OTR/L Behavioral Health OT/ Acute Relief OT PHP Office: [phone]  [name], [place]    Encounter Date: 01/16/2019  OT End of Session - 01/16/19 1511    Visit Number  3    Number of Visits  16    Date for OT Re-Evaluation  02/12/19    Authorization Type  [employer]    OT Start Time  1100    OT Stop Time  1200    OT Time Calculation (min)  60 min    Activity Tolerance  Patient tolerated treatment well    Behavior During Therapy  [facility] for tasks assessed/performed       Past Medical History:  Diagnosis Date  . Allergic rhinitis   . Anxiety   . Bradycardia 08/27/2016  . Depression   . Kidney stone   . Ureteral calculus, left 07/14/2013    Past Surgical History:  Procedure Laterality Date  . NO PAST SURGERIES      There  were no vitals filed for this visit.  Subjective Assessment - 01/16/19 1509    Currently in Pain?  No/denies         S: "I have lost my sense of self recently, I do not know who Teshaun is"  O: O:Education given on self esteem and how it relates to daily experiences. Pt asked to give definition of self esteem, and current rating of self esteem. Positive and negative contributors of self esteem to be brainstormed within group in relation to personal experiences.  A: Pt presents to group with flat affect, engaged and participatory throughout session. Pt shares how recently he has lost his sense of self. He shares that when he does not engage in personal hygiene his self esteem is decreased, as well as when he is eating unhealthy foods. He shares that having people around him that "tell him how it is" or tell him the truth helps to increase his self esteem.  P: OT Group will be x5 per week while pt in PHP.                     OT Education - 01/16/19 1509    Education Details  education given on self esteem    Person(s) Educated  Patient    Methods  Explanation;Handout    Comprehension  Verbalized understanding       OT Short  Term Goals - 01/15/19 1714      OT SHORT TERM GOAL #1   Title  Pt will be educated on strategies to improve psychosocial skills needed to participate fully in all daily, work, and leisure activities    Time  4    Period  Weeks    Status  On-going    Target Date  02/12/19      OT SHORT TERM GOAL #2   Title  Pt will apply psychosocial skills and coping mechanisms to daily activities in order to function independently and reintegrate into community    Time  4    Period  Weeks    Status  On-going    Target Date  02/12/19      OT SHORT TERM GOAL #3   Title  Pt will recall and/or apply 1-3 sleep hygiene strategies to improve function in BADL routine upon reintegrating into community    Time  4    Period  Weeks    Status  On-going    Target Date   02/12/19      OT SHORT TERM GOAL #4   Title  Pt will engage in goal setting to improve functional BADL/IADL routine upon reintegrating into community    Time  4    Period  Weeks    Status  On-going    Target Date  02/12/19               Plan - 01/16/19 1512    Occupational performance deficits (Please refer to evaluation for details):  ADL's;IADL's;Rest and Sleep;Work;Leisure;Social Participation    Psychosocial Skills  Coping Strategies;Habits;Routines and Behaviors;Environmental  Adaptations;Interpersonal Interaction       Patient will benefit from skilled therapeutic intervention in order to improve the following deficits and impairments:  Psychosocial Skills  Visit Diagnosis: MDD (major depressive disorder), recurrent severe, without psychosis (HCC)  Difficulty coping    Problem List Patient Active Problem List   Diagnosis Date Noted  . Current severe episode of major depressive disorder without psychotic features without prior episode (HCC) 01/10/2019  . Suicidal ideations 01/10/2019  . Marijuana smoker, continuous 01/10/2019  . Sinus bradycardia 01/10/2019  . Family history of colon cancer 01/10/2019  . Cigarette nicotine dependence without complication 01/10/2019  . Bradycardia 08/27/2016  . Ureteral calculus, left 07/14/2013  . Microhematuria 06/19/2013  . Encounter for preventative adult health care exam with abnormal findings 07/10/2011  . Allergic conjunctivitis 01/29/2009  . Allergic rhinitis 01/29/2009   Dalphine HandingKaylee Azriel Jakob, MSOT, OTR/L Behavioral Health OT/ Acute Relief OT PHP Office: [phone]  Dalphine HandingKaylee Doloris Servantes 01/16/2019, 3:14 PM  [facility] [address] [place], [place], [address] Phone: [phone]   Fax:  [phone]  Name: Blake Walsh MRN: [id number] Date of Birth: 1991/06/04

## 2019-01-16 NOTE — Psych (Signed)
Virtual Visit via Video Note  I connected with Blake Walsh on 01/14/19 at  9:00 AM EDT by a video enabled telemedicine application and verified that I am speaking with the correct person using two identifiers.   I discussed the limitations of evaluation and management by telemedicine and the availability of in person appointments. The patient expressed understanding and agreed to proceed.    I discussed the assessment and treatment plan with the patient. The patient was provided an opportunity to ask questions and all were answered. The patient agreed with the plan and demonstrated an understanding of the instructions.   The patient was advised to call back or seek an in-person evaluation if the symptoms worsen or if the condition fails to improve as anticipated.  Pt was provided 240 minutes of non-face-to-face time during this encounter.   Blake Guiles, LCSW    Blake [facility] BH PHP THERAPIST PROGRESS NOTE  Blake Walsh [id number]  Session Time: 9:00 - 10:00  Participation Level: Active  Behavioral Response: CasualAlertDepressed  Type of Therapy: Group Therapy  Treatment Goals addressed: Coping  Interventions: CBT, DBT, Supportive and Reframing  Summary: Clinician led check-in regarding current stressors and situation, and review of patient completed daily inventory. Clinician utilized active listening and empathetic response and validated patient emotions. Clinician facilitated processing group on pertinent issues  Therapist Response: Blake Walsh is a 28 y.o. male who presents with depression symptoms.  Patient arrived within time allowed and reports that he is feeling "okay."Patient rates his mood at [facility] a scale of 1-10 with 10 being great. Pt reports his weekend was "okay overall." Pt shares Saturday he spent alone with his daughter and that it was the "best day."  Pt reports on Sunday he talked to his father and that conversation has upset him since. Pt reports struggles in the  relationship with his parents as he feels they attempt to control him and don't take his wishes into account. Pt reports a current point of tension is that they are "demanding" to see pt's daughter and "guilt tripping" him when they do not have open access to her. Pt shares he wishes they did not affect him so much. Pt able to process. Patient engaged in discussion.      Session Time: 10:00 -11:00  Participation Level: Active  Behavioral Response: CasualAlertDepressed  Type of Therapy: Group Therapy, psychoeducation, psychotherapy  Treatment Goals addressed: Coping  Interventions: CBT, DBT, Solution Focused, Supportive and Reframing  Summary:  Cln led discussion on anger and how it can manifest for every person differently. Cln discussed anger as a place holder emotion that may be masking more difficult feelings. Group shared ways in which anger presents in their lives.   Therapist Response: Patient shares his anger will go quickly from 0 to 100. Pt states he has little insight into triggers or warning signs of his anger and is concerned because after he becomes angry, he eventually turns the anger on himself which increases his depression and decreases self esteem.       Session Time: 11:00 - 12:00   Participation Level: Active   Behavioral Response: CasualAlertDepressed   Type of Therapy: Group Therapy, OT   Treatment Goals addressed: Coping   Interventions: Psychosocial skills training, Supportive,    Summary:  Occupational Therapy group   Therapist Response: Patient engaged in group. See OT note.        Session Time: 12:00- 1:00  Participation Level: Active  Behavioral Response: CasualAlertDepressed  Type of Therapy: Group Therapy,  Psychoeducation; Psychotherapy  Treatment Goals addressed: Coping  Interventions: CBT; Solution focused; Supportive; Reframing  Summary: 12:00 - 12:50 Cln continued topic of distress tolerance skills and led review  of skills previously discussed. Cln introduced distraction skills and why distractions can be useful when we are emotional. Cln began education on ACCEPTS skills and group discussed how the skills could be utilized in their own lives. 12:50 -1:00 Clinician led check-out. Clinician assessed for immediate needs, medication compliance and efficacy, and safety concerns   Therapist Response: Patient reports understanding of skills discussed and states he aligns most with E- different emotion and can dance with his daughter to create a different feeling. At check-out, patient rates his mood at a 6 on a scale of 1-10 with 10 being great. Patient reports afternoon plans of spending time with his daughter, working out, and working on his resume. Patient demonstrates some progress as evidenced by participation in first group session. Patient denies SI/HI/self-harm at the end of group.    Suicidal/Homicidal: Nowithout intent/plan  Plan: Pt will continue in PHP while working to decrease depression and SI thoughts, and increase ability to manage symptoms in a healthy manner when they arise.   Diagnosis: MDD (major depressive disorder), recurrent severe, without psychosis (HCC) [F33.2]    1. MDD (major depressive disorder), recurrent severe, without psychosis (HCC)       Blake GuilesJenny Jozeph Persing, LCSW 01/16/2019

## 2019-01-16 NOTE — Progress Notes (Signed)
Pt attended spiritual care group 01/16/2019 11:00-12:00.  Group met via web-ex due to COVID-19 precautions.  Group facilitated by Chaplain Matthew Stalnaker, MDiv, BCC   Group focused on topic of "community."  Members reflected on topic in facilitated dialog, identifying responses to topic and notions they hold of community from their previous experience.  Group members utilized value sort cards to identify top qualities they look for in community.  Engaged in facilitated dialog around their value choices, noting origin of these values, how these are realized in their lives, and strategies for engaging these values.    Vinayak was present throughout group.  Engaged in group discussion and activity voluntarily.  Chet reflected on the television show "Community" and noted how the characters help each other through situations with acceptance.  He related this to the group.  He listed his top values as: Flexibility, Purpose, Service, Rationality and Responsibility.  Reflected with other group members about change and adaptablilty and awareness of how this affects him.   Matthew Stalnaker, MDiv, BCC 

## 2019-01-17 ENCOUNTER — Other Ambulatory Visit (HOSPITAL_COMMUNITY): Payer: 59 | Admitting: Licensed Clinical Social Worker

## 2019-01-17 ENCOUNTER — Other Ambulatory Visit: Payer: Self-pay

## 2019-01-17 ENCOUNTER — Other Ambulatory Visit (HOSPITAL_COMMUNITY): Payer: 59 | Admitting: Occupational Therapy

## 2019-01-17 ENCOUNTER — Encounter (HOSPITAL_COMMUNITY): Payer: Self-pay | Admitting: Occupational Therapy

## 2019-01-17 DIAGNOSIS — F332 Major depressive disorder, recurrent severe without psychotic features: Secondary | ICD-10-CM

## 2019-01-17 DIAGNOSIS — R4589 Other symptoms and signs involving emotional state: Secondary | ICD-10-CM

## 2019-01-17 NOTE — Psych (Signed)
Virtual Visit via Video Note  I connected with Blake Walsh on 01/16/19 at  9:00 AM EDT by a video enabled telemedicine application and verified that I am speaking with the correct person using two identifiers.   I discussed the limitations of evaluation and management by telemedicine and the availability of in person appointments. The patient expressed understanding and agreed to proceed.    I discussed the assessment and treatment plan with the patient. The patient was provided an opportunity to ask questions and all were answered. The patient agreed with the plan and demonstrated an understanding of the instructions.   The patient was advised to call back or seek an in-person evaluation if the symptoms worsen or if the condition fails to improve as anticipated.  Pt was provided 240 minutes of non-face-to-face time during this encounter.   Donia Guiles, LCSW    [facility] BH PHP THERAPIST PROGRESS NOTE  Blake Walsh [id number]  Session Time: 9:00 - 10:00  Participation Level: Active  Behavioral Response: CasualAlertDepressed  Type of Therapy: Group Therapy  Treatment Goals addressed: Coping  Interventions: CBT, DBT, Supportive and Reframing  Summary: Clinician led check-in regarding current stressors and situation, and review of patient completed daily inventory. Clinician utilized active listening and empathetic response and validated patient emotions. Clinician facilitated processing group on pertinent issues  Therapist Response: Blake Walsh is a 28 y.o. male who presents with depression symptoms.  Patient arrived within time allowed and reports that he is feeling "not great."Patient rates his mood at [facility] a scale of 1-10 with 10 being great. Pt reports his he had a rough day yesterday after a call from his father. Pt reports the call dealt with the same issue of his parents seeing pt's daughter. Pt reports his dad does not ask or respect his wishes, but demands and pushes his own  agenda. Pt reports after getting off the phone he spiraled and began having self harm thoughts. Pt reports thinking of banging his head on the wall and thinking that if he did it hard enough it could kill him. Pt denies intent and states recognizing it was an emotional reaction. Pt reports not acting because he had to leave for his couple's therapy appointment and being sufficiently distracted upon returning home afterwards. Pt reports he engaged in impulsive shopping later in the evening and is torn between self-indulgence and guilt. Pt denies this is an ongoing pattern and that it began in the past year. pt states his impulse spending has not put his finances in danger. Pt decides he will continue to think about whether he will keep or return the purchases.  Patient engaged in discussion.      Session Time: 10:00 -11:00  Participation Level:Active  Behavioral Response:CasualAlertDepressed  Type of Therapy: Group Therapy, OT  Treatment Goals addressed: Coping  Interventions:Psychosocial skills training, Supportive,   Summary:Occupational Therapy group  Therapist Response: Patient engaged in group. See OT note.       Session Time: 11:00 -12:00  Participation Level:Active  Behavioral Response:CasualAlertDepressed  Type of Therapy: Group Therapy, psychotherapy  Treatment Goals addressed: Coping  Interventions:Strengths based, reframing, Supportive,   Summary:Spiritual Care group  Therapist Response: Patient engaged in group. See chaplain note.       Session Time: 12:00- 1:00  Participation Level:Active  Behavioral Response:CasualAlertDepressed  Type of Therapy: Group Therapy, Psychoeducation  Treatment Goals addressed: Coping  Interventions:relaxation training; Supportive; Reframing  Summary:12:00 - 12:50: Relaxation group: Cln led group focused on retraining the body's response to stress.  12:50 -1:00  Clinician led check-out. Clinician assessed for immediate needs, medication compliance and efficacy, and safety concerns  Therapist Response:Pt engaged in activity and discussion. At check-out, patient rates hismood at a 5on a scale of 1-10 with 10 being great. Patient reports afternoon plansofeating lunch, doing laundry, and continuing to work on his resume. Patient demonstrates some progress as evidenced by gaining insight into how to handle issue with his father. Patient denies SI/HI/self-harm thoughts at the end of group.     Suicidal/Homicidal: Nowithout intent/plan  Plan: Pt will continue in PHP while working to decrease depression and SI thoughts, and increase ability to manage symptoms in a healthy manner when they arise.   Diagnosis: MDD (major depressive disorder), recurrent severe, without psychosis (HCC) [F33.2]    1. MDD (major depressive disorder), recurrent severe, without psychosis (HCC)       Donia Guiles, LCSW 01/17/2019

## 2019-01-17 NOTE — Therapy (Signed)
[facility] [address] [place], [place], [address] Phone: [phone]   Fax:  [phone]  Occupational Therapy Treatment  Patient Details  Name: Blake Walsh MRN: [id number] Date of Birth: 1990-10-28 Referring Provider (OT): Hillery Jacksanika Lewis, NP  Virtual Visit via Video Note  I connected with Blake Walsh on 01/17/19 at  8:00 AM EDT by a video enabled telemedicine application and verified that I am speaking with the correct person using two identifiers.   I discussed the limitations of evaluation and management by telemedicine and the availability of in person appointments. The patient expressed understanding and agreed to proceed.   I discussed the assessment and treatment plan with the patient. The patient was provided an opportunity to ask questions and all were answered. The patient agreed with the plan and demonstrated an understanding of the instructions.   The patient was advised to call back or seek an in-person evaluation if the symptoms worsen or if the condition fails to improve as anticipated.  I provided 60 minutes of non-face-to-face time during this encounter.  Dalphine HandingKaylee Maya Arcand, MSOT, OTR/L Behavioral Health OT/ Acute Relief OT PHP Office: [phone]  Dalphine HandingKaylee Malessa Zartman, [place]    Encounter Date: 01/17/2019  OT End of Session - 01/17/19 1316    Visit Number  4    Number of Visits  16    Date for OT Re-Evaluation  02/12/19    Authorization Type  [employer]    OT Start Time  1100    OT Stop Time  1200    OT Time Calculation (min)  60 min    Activity Tolerance  Patient tolerated treatment well    Behavior During Therapy  [facility] for tasks assessed/performed       Past Medical History:  Diagnosis Date  . Allergic rhinitis   . Anxiety   . Bradycardia 08/27/2016  . Depression   . Kidney stone   . Ureteral calculus, left 07/14/2013    Past Surgical History:  Procedure Laterality Date  . NO PAST SURGERIES      There  were no vitals filed for this visit.  Subjective Assessment - 01/17/19 1310    Currently in Pain?  No/denies       S: "This was really hard to see the negative"  O: Continued education and activities given on self esteem and how to improve current practices in daily life. Worksheet given for pt to identify a positive trait about themselves with each letter of the alphabet to use as reference for future times of need and to gain insight on numerous positive qualities. Additional activity given: self confidence worksheet completed identifying previous positive situations and previous negative situations and how it was handled. Breakdown of past experiences used to apply to future situations to help foster self confidence when engaging BADL/IADL and integrating into the community.  A: Pt presents to group with flat affect, becoming tearful throughout session. Pt completed A-Z activity, stating it was relatively easy. Past vs present activity then completed, with pt becoming tearful and having difficulty to finish. He states that the negative situation was really difficult for him to process. He became very negative, tearful, and turned off his camera. Counselor informed to check in on pt for safety. Counselor followed up after group.  P: OT group to be x5 per week while pt in PHP.                    OT Education -  01/17/19 1313    Education Details  continued education given on self esteem    Person(s) Educated  Patient    Methods  Explanation;Handout    Comprehension  Verbalized understanding       OT Short Term Goals - 01/15/19 1714      OT SHORT TERM GOAL #1   Title  Pt will be educated on strategies to improve psychosocial skills needed to participate fully in all daily, work, and leisure activities    Time  4    Period  Weeks    Status  On-going    Target Date  02/12/19      OT SHORT TERM GOAL #2   Title  Pt will apply psychosocial skills and coping mechanisms to  daily activities in order to function independently and reintegrate into community    Time  4    Period  Weeks    Status  On-going    Target Date  02/12/19      OT SHORT TERM GOAL #3   Title  Pt will recall and/or apply 1-3 sleep hygiene strategies to improve function in BADL routine upon reintegrating into community    Time  4    Period  Weeks    Status  On-going    Target Date  02/12/19      OT SHORT TERM GOAL #4   Title  Pt will engage in goal setting to improve functional BADL/IADL routine upon reintegrating into community    Time  4    Period  Weeks    Status  On-going    Target Date  02/12/19               Plan - 01/17/19 1317    Occupational performance deficits (Please refer to evaluation for details):  ADL's;IADL's;Rest and Sleep;Work;Leisure;Social Participation    Psychosocial Skills  Coping Strategies;Habits;Routines and Behaviors;Environmental  Adaptations;Interpersonal Interaction       Patient will benefit from skilled therapeutic intervention in order to improve the following deficits and impairments:  Psychosocial Skills  Visit Diagnosis: MDD (major depressive disorder), recurrent severe, without psychosis (HCC)  Difficulty coping    Problem List Patient Active Problem List   Diagnosis Date Noted  . Current severe episode of major depressive disorder without psychotic features without prior episode (HCC) 01/10/2019  . Suicidal ideations 01/10/2019  . Marijuana smoker, continuous 01/10/2019  . Sinus bradycardia 01/10/2019  . Family history of colon cancer 01/10/2019  . Cigarette nicotine dependence without complication 01/10/2019  . Bradycardia 08/27/2016  . Ureteral calculus, left 07/14/2013  . Microhematuria 06/19/2013  . Encounter for preventative adult health care exam with abnormal findings 07/10/2011  . Allergic conjunctivitis 01/29/2009  . Allergic rhinitis 01/29/2009   [name], MSOT, OTR/L Behavioral Health OT/ Acute Relief  OT PHP Office: [phone]  [name] 01/17/2019, 1:19 PM  [facility] HOSPITALIZATION PROGRAM [address] [place], [place], [address] Phone: [phone]   Fax:  [phone]  Name: Blake Walsh MRN: [id number] Date of Birth: Aug 30, 1991

## 2019-01-17 NOTE — Psych (Signed)
Virtual Visit via Video Note  I connected with Blake Walsh on 01/15/19 at  9:00 AM EDT by a video enabled telemedicine application and verified that I am speaking with the correct person using two identifiers.   I discussed the limitations of evaluation and management by telemedicine and the availability of in person appointments. The patient expressed understanding and agreed to proceed.    I discussed the assessment and treatment plan with the patient. The patient was provided an opportunity to ask questions and all were answered. The patient agreed with the plan and demonstrated an understanding of the instructions.   The patient was advised to call back or seek an in-person evaluation if the symptoms worsen or if the condition fails to improve as anticipated.  Pt was provided 240 minutes of non-face-to-face time during this encounter.   Donia GuilesJenny Olivine Hiers, LCSW    [facility] PHP THERAP[facility]GR[facility]  Blake Walsh [id number]  Session Time: 9:00 - 10:00  Participation Level: Active  Behavioral Response: CasualAlertDepressed  Type of Therapy: Group Therapy  Treatment Goals addressed: Coping  Interventions: CBT, DBT, Supportive and Reframing  Summary: Clinician led check-in regarding current stressors and situation, and review of patient completed daily inventory. Clinician utilized active listening and empathetic response and validated patient emotions. Clinician facilitated processing group on pertinent issues  Therapist Response: Blake Walsh is a 28 y.o. male who presents with depression symptoms.  Patient arrived within time allowed and reports that he is feeling "okay."[facility] with 10 being great. Pt reports his afternoon was uneventful and that he watched his daughter and did some work on his resume, but did not workout as planned. Patient engaged in discussion.      Session Time: 10:00 -11:00  Participation Level:  Active  Behavioral Response: CasualAlertDepressed  Type of Therapy: Group Therapy, psychoeducation, psychotherapy  Treatment Goals addressed: Coping  Interventions: CBT, DBT, Solution Focused, Supportive and Reframing  Summary:  Cln led discussion on dealing with toxic people. Cln discussed how boundaries, good communication, and acceptance play a role in working with difficult people and how to apply those tenets. Group shared their experiences and offered feedback.    Therapist Response: Patient uses his father as an example of a difficult person to deal with. Pt shares he has not figured out a way to effectively work with him and struggles to due to his father's aggressive nature.       Session Time: 11:00 - 12:00   Participation Level: Active   Behavioral Response: CasualAlertDepressed   Type of Therapy: Group Therapy, OT   Treatment Goals addressed: Coping   Interventions: Psychosocial skills training, Supportive,    Summary:  Occupational Therapy group   Therapist Response: Patient engaged in group. See OT note.        Session Time: 12:00- 1:00  Participation Level: Active  Behavioral Response: CasualAlertDepressed  Type of Therapy: Group Therapy, Psychoeducation; Psychotherapy  Treatment Goals addressed: Coping  Interventions: CBT; Solution focused; Supportive; Reframing  Summary: 12:00 - 12:50 Cln continued topic of distress tolerance skills and led recap of the ACCEPTS skills discussed yesterday. Group finished discussion of ACCEPTS skills and identified ways to practice in their own lives.  12:50 -1:00 Clinician led check-out. Clinician assessed for immediate needs, medication compliance and efficacy, and safety concerns   Therapist Response: Patient reports understanding of skills discussed and states T-thought skills can be helpful to him at work when he is unable to engage  other than mentally.  At check-out, patient rates his mood at a 5 on a  scale of 1-10 with 10 being great. Patient reports afternoon plans of picking up prescriptions, going to couples counseling, and chores around the house. Patient demonstrates some progress as evidenced by sharing increased willingness to try medications. Patient denies SI/HI/self-harm at the end of group.    Suicidal/Homicidal: Nowithout intent/plan  Plan: Pt will continue in PHP while working to decrease depression and SI thoughts, and increase ability to manage symptoms in a healthy manner when they arise.   Diagnosis: MDD (major depressive disorder), recurrent severe, without psychosis (HCC) [F33.2]    1. MDD (major depressive disorder), recurrent severe, without psychosis (HCC)   2. Current mild episode of major depressive disorder, unspecified whether recurrent (HCC)   3. Difficulty coping       Donia Guiles, LCSW 01/17/2019

## 2019-01-18 ENCOUNTER — Other Ambulatory Visit (HOSPITAL_COMMUNITY): Payer: 59 | Admitting: Occupational Therapy

## 2019-01-18 ENCOUNTER — Other Ambulatory Visit: Payer: Self-pay

## 2019-01-18 ENCOUNTER — Encounter (HOSPITAL_COMMUNITY): Payer: Self-pay | Admitting: Occupational Therapy

## 2019-01-18 ENCOUNTER — Other Ambulatory Visit (HOSPITAL_COMMUNITY): Payer: 59 | Admitting: Licensed Clinical Social Worker

## 2019-01-18 DIAGNOSIS — F332 Major depressive disorder, recurrent severe without psychotic features: Secondary | ICD-10-CM | POA: Diagnosis not present

## 2019-01-18 DIAGNOSIS — R4589 Other symptoms and signs involving emotional state: Secondary | ICD-10-CM

## 2019-01-18 NOTE — Therapy (Signed)
[facility] PARTIAL HOSPITALIZATION PROGRAM [address] [place], [place], [address] Phone: [phone]   Fax:  [phone]  Occupational Therapy Treatment  Patient Details  Name: Blake Walsh MRN: [id number] Date of Birth: 01-23-1991 Referring Provider (OT): [name], NP  Virtual Visit via Video Note  I connected with Blake Walsh on 01/18/19 at  8:00 AM EDT by a video enabled telemedicine application and verified that I am speaking with the correct person using two identifiers.   I discussed the limitations of evaluation and management by telemedicine and the availability of in person appointments. The patient expressed understanding and agreed to proceed.  I discussed the assessment and treatment plan with the patient. The patient was provided an opportunity to ask questions and all were answered. The patient agreed with the plan and demonstrated an understanding of the instructions.   The patient was advised to call back or seek an in-person evaluation if the symptoms worsen or if the condition fails to improve as anticipated.  I provided 60 minutes of non-face-to-face time during this encounter.  [name], MSOT, OTR/L Behavioral Health OT/ Acute Relief OT PHP Office: [phone]  [name], [place]    Encounter Date: 01/18/2019  OT End of Session - 01/18/19 1427    Visit Number  5    Number of Visits  16    Date for OT Re-Evaluation  02/12/19    Authorization Type  [employer]    OT Start Time  1100    OT Stop Time  1200    OT Time Calculation (min)  60 min    Activity Tolerance  Patient tolerated treatment well    Behavior During Therapy  [facility] for tasks assessed/performed       Past Medical History:  Diagnosis Date  . Allergic rhinitis   . Anxiety   . Bradycardia 08/27/2016  . Depression   . Kidney stone   . Ureteral calculus, left 07/14/2013    Past Surgical History:  Procedure Laterality Date  . NO PAST SURGERIES      There  were no vitals filed for this visit.  Subjective Assessment - 01/18/19 1426    Currently in Pain?  No/denies       S: "My routine has been entirely thrown off an it has been very difficult to cope"   O: Education given on importance of routine when balancing occupations, but in specific relation to COVID-19 pandemic. Pt asked to share current hardships they are facing with the stay at home orders, in relation to their personal occupations. Education and problem solving given for personal situations. Pt asked to share current goals for building routine during the time of the pandemic.  A:  Pt presents to group with flat/depressed affect, engaged and participatory this date. Pt shares how he is used to going to work and having his young 5 month old daughter in daycare, and is now home with his wife and daughter at all times. He shares how childcare duties have become arguments with his wife and he is struggling with not having his alone time any more. Offered solutions to pt such as a childcare chart (similar to their chore chart) or designating separate spaces for each other's quiet time. Pt somewhat resistant to new ideas and automatically stating it will not work, but states he still is taking in and appreciative of the information. He does hare how writing out his workout routine will be helpful and that he has been consistent  for the past few days. Continued routine management and reinforcement will be discussed with pt.  P: OT group will be x5 per week while pt in PHP.                  OT Education - 01/18/19 1426    Education Details  education given on maintaining routines in COVID 19 pandemic    Person(s) Educated  Patient    Methods  Explanation;Handout    Comprehension  Verbalized understanding       OT Short Term Goals - 01/15/19 1714      OT SHORT TERM GOAL #1   Title  Pt will be educated on strategies to improve psychosocial skills needed to participate fully in  all daily, work, and leisure activities    Time  4    Period  Weeks    Status  On-going    Target Date  02/12/19      OT SHORT TERM GOAL #2   Title  Pt will apply psychosocial skills and coping mechanisms to daily activities in order to function independently and reintegrate into community    Time  4    Period  Weeks    Status  On-going    Target Date  02/12/19      OT SHORT TERM GOAL #3   Title  Pt will recall and/or apply 1-3 sleep hygiene strategies to improve function in BADL routine upon reintegrating into community    Time  4    Period  Weeks    Status  On-going    Target Date  02/12/19      OT SHORT TERM GOAL #4   Title  Pt will engage in goal setting to improve functional BADL/IADL routine upon reintegrating into community    Time  4    Period  Weeks    Status  On-going    Target Date  02/12/19               Plan - 01/18/19 1427    Occupational performance deficits (Please refer to evaluation for details):  ADL's;IADL's;Rest and Sleep;Work;Leisure;Social Participation    Psychosocial Skills  Coping Strategies;Habits;Routines and Behaviors;Environmental  Adaptations;Interpersonal Interaction       Patient will benefit from skilled therapeutic intervention in order to improve the following deficits and impairments:  Psychosocial Skills  Visit Diagnosis: MDD (major depressive disorder), recurrent severe, without psychosis (HCC)  Difficulty coping    Problem List Patient Active Problem List   Diagnosis Date Noted  . Current severe episode of major depressive disorder without psychotic features without prior episode (HCC) 01/10/2019  . Suicidal ideations 01/10/2019  . Marijuana smoker, continuous 01/10/2019  . Sinus bradycardia 01/10/2019  . Family history of colon cancer 01/10/2019  . Cigarette nicotine dependence without complication 01/10/2019  . Bradycardia 08/27/2016  . Ureteral calculus, left 07/14/2013  . Microhematuria 06/19/2013  . Encounter  for preventative adult health care exam with abnormal findings 07/10/2011  . Allergic conjunctivitis 01/29/2009  . Allergic rhinitis 01/29/2009   Dalphine HandingKaylee Hoy Fallert, MSOT, OTR/L Behavioral Health OT/ Acute Relief OT PHP Office: [phone]  Dalphine HandingKaylee Nezar Buckles 01/18/2019, 2:27 PM  [facility] PARTIAL HOSPITAL[facility] PROGRAM [address] [place], [place], [address] Phone: [phone]   Fax:  [phone]  Name: Blake Walsh MRN: [id number] Date of Birth: 1991-06-07

## 2019-01-18 NOTE — Psych (Signed)
Virtual Visit via Video Note  I connected with Blake Walsh on 01/18/19 at  9:00 AM EDT by a video enabled telemedicine application and verified that I am speaking with the correct person using two identifiers.   I discussed the limitations of evaluation and management by telemedicine and the availability of in person appointments. The patient expressed understanding and agreed to proceed.    I discussed the assessment and treatment plan with the patient. The patient was provided an opportunity to ask questions and all were answered. The patient agreed with the plan and demonstrated an understanding of the instructions.   The patient was advised to call back or seek an in-person evaluation if the symptoms worsen or if the condition fails to improve as anticipated.  Pt was provided 240 minutes of non-face-to-face time during this encounter.   Donia Guiles, LCSW    [facility] BH PHP THERAPIST PROGRESS NOTE  Blake Walsh [id number]  Session Time: 9:00 - 10:00  Participation Level: Active  Behavioral Response: CasualAlertDepressed  Type of Therapy: Group Therapy  Treatment Goals addressed: Coping  Interventions: CBT, DBT, Supportive and Reframing  Summary: Clinician led check-in regarding current stressors and situation, and review of patient completed daily inventory. Clinician utilized active listening and empathetic response and validated patient emotions. Clinician facilitated processing group on pertinent issues  Therapist Response: Blake Walsh is a 28 y.o. male who presents with depression symptoms.  Patient arrived within time allowed and reports that he is feeling "annoyed and frustrated."Patient rates his mood at a3on a scale of 1-10 with 10 being great. Pt reports he has had a rough morning and doesn't want to be in group, but came anyway. Pt states he been up since 2:30 am and he and his wife got into an argument this morning. Pt reports waking up because his daughter woke up but  not being able to go back to sleep. Pt states he spent his time on the internet and playing [place]. Pt states he doesn't know what started the fight with his wife, but things are still tense. Pt able to process. Patient engaged in discussion.      Session Time: 10:00 -11:00  Participation Level: Active  Behavioral Response: CasualAlertDepressed  Type of Therapy: Group Therapy, psychoeducation, psychotherapy  Treatment Goals addressed: Coping  Interventions: CBT, DBT, Solution Focused, Supportive and Reframing  Summary:  Cln led discussion on how to have effective communication during a conflict. Group shared their experiences with communicating when emotional. Group discussed narrating, pressing pause, and setting specific times to return to the issue as ways to navigate different communication styles and heightened emotions.    Therapist Response: Patient shares he feels he does not communicate effectively when there is conflict. Pt states he and his wife have opposite communication styles and he does not know how to bridge the gap. Pt reports he can try narrating his thoughts to wife and talk at a neutral time about why pausing the conversation until he can calm down and process would be helpful. Pt appears agitated during session, however accepts information.       Session Time: 11:00 - 12:00   Participation Level: Active   Behavioral Response: CasualAlertDepressed   Type of Therapy: Group Therapy, OT   Treatment Goals addressed: Coping   Interventions: Psychosocial skills training, Supportive,    Summary:  Occupational Therapy group   Therapist Response: Patient engaged in group. See OT note.        Session Time: 12:00- 1:00  Participation Level: Active  Behavioral Response: CasualAlertDepressed  Type of Therapy: Group Therapy, Psychoeducation; Psychotherapy  Treatment Goals addressed: Coping  Interventions: CBT; Solution focused;  Supportive; Reframing  Summary: 12:00 - 12:50 Clinician continued topic of cognitive distortions. Cln completed review of cognitive distortion handout and group members determined examples of how the distortions play out and effect their lives.   12:50 -1:00 Clinician led check-out. Clinician assessed for immediate needs, medication compliance and efficacy, and safety concerns   Therapist Response: Patient reports recognizing the way should statements and black and white thinking were a part of the conflict with his wife this morning. Pt states that he can recognize the distortions are common for him but that he feels mentally unable to think about that further today.    At check-out, patient rates his mood at a 5 on a scale of 1-10 with 10 being great. Patient reports afternoon plans of working out and playing a game. Patient demonstrates some progress as evidenced by coming to group when he didn't want to.  Pt denies SI/HI at end of group.     Suicidal/Homicidal: Nowithout intent/plan  Plan: Pt will continue in PHP while working to decrease depression and SI thoughts, and increase ability to manage symptoms in a healthy manner when they arise.   Diagnosis: MDD (major depressive disorder), recurrent severe, without psychosis (HCC) [F33.2]    1. MDD (major depressive disorder), recurrent severe, without psychosis (HCC)       Donia GuilesJenny Jameon Deller, LCSW 01/18/2019

## 2019-01-18 NOTE — Psych (Signed)
Virtual Visit via Video Note  I connected with Blake Walsh on 01/17/19 at  9:00 AM EDT by a video enabled telemedicine application and verified that I am speaking with the correct person using two identifiers.   I discussed the limitations of evaluation and management by telemedicine and the availability of in person appointments. The patient expressed understanding and agreed to proceed.    I discussed the assessment and treatment plan with the patient. The patient was provided an opportunity to ask questions and all were answered. The patient agreed with the plan and demonstrated an understanding of the instructions.   The patient was advised to call back or seek an in-person evaluation if the symptoms worsen or if the condition fails to improve as anticipated.  Pt was provided 240 minutes of non-face-to-face time during this encounter.   Blake GuilesJenny Khaliel Morey, LCSW    [facility] BH PHP THERAPIST PROGRESS NOTE  Blake Walsh [id number]  Session Time: 9:00 - 10:00  Participation Level: Active  Behavioral Response: CasualAlertDepressed  Type of Therapy: Group Therapy  Treatment Goals addressed: Coping  Interventions: CBT, DBT, Supportive and Reframing  Summary: Clinician led check-in regarding current stressors and situation, and review of patient completed daily inventory. Clinician utilized active listening and empathetic response and validated patient emotions. Clinician facilitated processing group on pertinent issues  Therapist Response: Blake MiyamotoKyle Walsh is a 28 y.o. male who presents with depression symptoms.  Patient arrived within time allowed and reports that he is feeling "better than yesterday."Patient [facility]on a scale of 1-10 with 10 being great. Pt reports he "took a lot away from group yesterday" and after group set boundaries with his parents. Pt states he told them he will not talk to them for the next 3 weeks while he works on himself and that they can see his daughter  weekly on facetime. Pt states he felt like they weren't listening to him based on responses and his mom has already texted him twice since then. Pt reports he is not responding and will not. Pt reports he is happy he set the boundary and also feels conflicting feelings of guilt and resentment that he had to set a boundary. Pt able to process. Patient engaged in discussion.      Session Time: 10:00 -11:00  Participation Level: Active  Behavioral Response: CasualAlertDepressed  Type of Therapy: Group Therapy, psychoeducation, psychotherapy  Treatment Goals addressed: Coping  Interventions: CBT, DBT, Solution Focused, Supportive and Reframing  Summary:  Cln led discussion on how to find power within an uncontrollable situation. Group discussed situations in their life that feel uncontrollable and brainstormed ways to regain agency in those situations.   Therapist Response: Patient reports feeling out of control in his relationships with wife and parents because he cannot control their reactions or feelings. Pt expresses frustration and acknowledges that he cannot control other people. Pt states he is hopeful boundary setting will improve the feeling of lack of control.       Session Time: 11:00 - 12:00   Participation Level: Active   Behavioral Response: CasualAlertDepressed   Type of Therapy: Group Therapy, OT   Treatment Goals addressed: Coping   Interventions: Psychosocial skills training, Supportive,    Summary:  Occupational Therapy group   Therapist Response: Patient engaged in group. See OT note.        Session Time: 12:00- 1:00  Participation Level: Active  Behavioral Response: CasualAlertDepressed  Type of Therapy: Group Therapy, Psychoeducation; Psychotherapy  Treatment Goals addressed:  Coping  Interventions: CBT; Solution focused; Supportive; Reframing  Summary: 12:00 - 12:50 Clinician introduced topic of cognitive distortions. Cln educated  on what cognitive distortions are and how they affect [name]. Group began review of cognitive distortion handout and came up with examples to work on noticing distortions as they occur.  12:50 -1:00 Clinician led check-out. Clinician assessed for immediate needs, medication compliance and efficacy, and safety concerns   Therapist Response: Patient reports understanding of distortions discussed today and is able to recognize how mind reading plays into his interpersonal struggles.   At check-out, patient rates his mood at a 3 on a scale of 1-10 with 10 being great. Patient reports no specific afternoon plans. Patient demonstrates some progress as evidenced by setting boundaries with his parents yesterday.  At end of group pt is emotional and unable to say if he will be safe from self injury. Cln spoke with pt 1-on-1 to de-escalate and safety plan. Pt reports OT group stuck a nerve because he realized this is this worst place he has been in his life and that was upsetting. Cln processed with him, validating and reframing. Pt reports he is hungry right now and knows that is not helping his mood so he will get something to eat as soon as we are done. Pt reports he is "100% never worried" about safety when he is around his daughter, so states he will plan to spend time with her after eating and he can put on music for them to dance, which has been a good different emotion skill for him in the past. Pt able to say by end of discussion that he feels safe and commits to not harm himself in any way prior to group tomorrow. Pt denies SI/HI.     Suicidal/Homicidal: Nowithout intent/plan  Plan: Pt will continue in PHP while working to decrease depression and SI thoughts, and increase ability to manage symptoms in a healthy manner when they arise.   Diagnosis: MDD (major depressive disorder), recurrent severe, without psychosis (HCC) [F33.2]    1. MDD (major depressive disorder), recurrent severe, without psychosis  (HCC)       Blake Guiles, LCSW 01/18/2019

## 2019-01-21 ENCOUNTER — Encounter (HOSPITAL_COMMUNITY): Payer: Self-pay | Admitting: Occupational Therapy

## 2019-01-21 ENCOUNTER — Other Ambulatory Visit: Payer: Self-pay

## 2019-01-21 ENCOUNTER — Other Ambulatory Visit (HOSPITAL_COMMUNITY): Payer: 59 | Admitting: Occupational Therapy

## 2019-01-21 ENCOUNTER — Other Ambulatory Visit (HOSPITAL_COMMUNITY): Payer: 59 | Admitting: Licensed Clinical Social Worker

## 2019-01-21 DIAGNOSIS — F332 Major depressive disorder, recurrent severe without psychotic features: Secondary | ICD-10-CM | POA: Diagnosis not present

## 2019-01-21 DIAGNOSIS — R4589 Other symptoms and signs involving emotional state: Secondary | ICD-10-CM

## 2019-01-21 NOTE — Therapy (Signed)
[facility] PARTIAL HOSPITALIZATION PROGRAM [address] [place], [place], [address] Phone: [phone]   Fax:  [phone]  Occupational Therapy Treatment  Patient Details  Name: Blake Walsh MRN: [id number] Date of Birth: 01/09/1991 Referring Provider (OT): Hillery Jacksanika Lewis, NP  Virtual Visit via Video Note  I connected with Blake Walsh on 01/21/19 at  8:00 AM EDT by a video enabled telemedicine application and verified that I am speaking with the correct person using two identifiers.   I discussed the limitations of evaluation and management by telemedicine and the availability of in person appointments. The patient expressed understanding and agreed to proceed.   I discussed the assessment and treatment plan with the patient. The patient was provided an opportunity to ask questions and all were answered. The patient agreed with the plan and demonstrated an understanding of the instructions.   The patient was advised to call back or seek an in-person evaluation if the symptoms worsen or if the condition fails to improve as anticipated.  I provided 60 minutes of non-face-to-face time during this encounter.  Dalphine HandingKaylee Glendale Wherry, MSOT, OTR/L Behavioral Health OT/ Acute Relief OT PHP Office: [phone]  Dalphine HandingKaylee Jacori [place], [place]    Encounter Date: 01/21/2019  OT End of Session - 01/21/19 1420    Visit Number  6    Number of Visits  16    Date for OT Re-Evaluation  02/12/19    Authorization Type  [employer]    OT Start Time  1100    OT Stop Time  1200    OT Time Calculation (min)  60 min    Activity Tolerance  Patient tolerated treatment well    Behavior During Therapy  [facility] for tasks assessed/performed       Past Medical History:  Diagnosis Date  . Allergic rhinitis   . Anxiety   . Bradycardia 08/27/2016  . Depression   . Kidney stone   . Ureteral calculus, left 07/14/2013    Past Surgical History:  Procedure Laterality Date  . NO PAST SURGERIES      There  were no vitals filed for this visit.  Subjective Assessment - 01/21/19 1420    Currently in Pain?  No/denies       S: "I suppress a lot of my stress"   O: Stress management group completed to use as productive coping strategy, to help mitigate maladaptive coping to integrate in functional BADL/IADL. Education given on the definition of stress and its cognitive, behavioral, emotional, and physical effects on the body. Stress symptom checklist completed to raise insight on current symptoms felt with stress. Stress management tool worksheet discussed to educate on unhealthy vs healthy coping skills to manage stress to improve community integration. Continued stress management education to be given.  A: Pt presents with blunted affect, brighter this date than previous sessions. Pt in understanding of stress management education given, stating that he uses two main unhealthy coping mechanisms. Pt shares that he has used marijuana to cope with stress for the past 3-4 years of his life. He also shares how he suppresses his emotions which can lead to him feeling like he is "going to explode". Healthy coping skills to be discussed next treatment date.  P: OT group will be x4 per week while pt in PHP.                   OT Education - 01/21/19 1420    Education Details  education given on stress management  Person(s) Educated  Patient    Methods  Explanation;Handout    Comprehension  Verbalized understanding       OT Short Term Goals - 01/15/19 1714      OT SHORT TERM GOAL #1   Title  Pt will be educated on strategies to improve psychosocial skills needed to participate fully in all daily, work, and leisure activities    Time  4    Period  Weeks    Status  On-going    Target Date  02/12/19      OT SHORT TERM GOAL #2   Title  Pt will apply psychosocial skills and coping mechanisms to daily activities in order to function independently and reintegrate into community    Time  4     Period  Weeks    Status  On-going    Target Date  02/12/19      OT SHORT TERM GOAL #3   Title  Pt will recall and/or apply 1-3 sleep hygiene strategies to improve function in BADL routine upon reintegrating into community    Time  4    Period  Weeks    Status  On-going    Target Date  02/12/19      OT SHORT TERM GOAL #4   Title  Pt will engage in goal setting to improve functional BADL/IADL routine upon reintegrating into community    Time  4    Period  Weeks    Status  On-going    Target Date  02/12/19               Plan - 01/21/19 1458    OT Frequency  4x / week    OT Duration  4 weeks       Patient will benefit from skilled therapeutic intervention in order to improve the following deficits and impairments:  Psychosocial Skills  Visit Diagnosis: MDD (major depressive disorder), recurrent severe, without psychosis (HCC)  Difficulty coping    Problem List Patient Active Problem List   Diagnosis Date Noted  . Current severe episode of major depressive disorder without psychotic features without prior episode (HCC) 01/10/2019  . Suicidal ideations 01/10/2019  . Marijuana smoker, continuous 01/10/2019  . Sinus bradycardia 01/10/2019  . Family history of colon cancer 01/10/2019  . Cigarette nicotine dependence without complication 01/10/2019  . Bradycardia 08/27/2016  . Ureteral calculus, left 07/14/2013  . Microhematuria 06/19/2013  . Encounter for preventative adult health care exam with abnormal findings 07/10/2011  . Allergic conjunctivitis 01/29/2009  . Allergic rhinitis 01/29/2009   [name], MSOT, OTR/L Behavioral Health OT/ Acute Relief OT PHP Office: [phone]  [name] 01/21/2019, 2:58 PM  [facility] PARTIAL HOSPITALIZATION PROGRAM [address] [place], [place], [address] Phone: [phone]   Fax:  [phone]  Name: Blake Walsh MRN: [id number] Date of Birth: [birth year]

## 2019-01-22 ENCOUNTER — Encounter (HOSPITAL_COMMUNITY): Payer: Self-pay | Admitting: Occupational Therapy

## 2019-01-22 ENCOUNTER — Other Ambulatory Visit (HOSPITAL_COMMUNITY): Payer: 59 | Admitting: Occupational Therapy

## 2019-01-22 ENCOUNTER — Other Ambulatory Visit: Payer: Self-pay

## 2019-01-22 ENCOUNTER — Other Ambulatory Visit (HOSPITAL_COMMUNITY): Payer: 59 | Admitting: Licensed Clinical Social Worker

## 2019-01-22 ENCOUNTER — Telehealth (HOSPITAL_COMMUNITY): Payer: Self-pay | Admitting: Professional

## 2019-01-22 DIAGNOSIS — F332 Major depressive disorder, recurrent severe without psychotic features: Secondary | ICD-10-CM | POA: Diagnosis not present

## 2019-01-22 DIAGNOSIS — R4589 Other symptoms and signs involving emotional state: Secondary | ICD-10-CM

## 2019-01-22 NOTE — Therapy (Signed)
[facility] PART[facility]GRAM [address] [place], [place], [address] Phone: [phone]   Fax:  [phone]  Occupational Therapy Treatment  Patient Details  Name: Blake Walsh MRN: [id number] Date of Birth: July 04, 1991 Referring Provider (OT): Hillery Jacksanika Lewis, NP  Virtual Visit via Video Note  I connected with Blake Walsh on 01/22/19 at  8:00 AM EDT by a video enabled telemedicine application and verified that I am speaking with the correct person using two identifiers.   I discussed the limitations of evaluation and management by telemedicine and the availability of in person appointments. The patient expressed understanding and agreed to proceed.  I discussed the assessment and treatment plan with the patient. The patient was provided an opportunity to ask questions and all were answered. The patient agreed with the plan and demonstrated an understanding of the instructions.   The patient was advised to call back or seek an in-person evaluation if the symptoms worsen or if the condition fails to improve as anticipated.  I provided 60  minutes of non-face-to-face time during this encounter.  Dalphine HandingKaylee Deundra Bard, MSOT, OTR/L Behavioral Health OT/ Acute Relief OT PHP Office: [phone]  Dalphine HandingKaylee Saniyah Mondesir, [place]    Encounter Date: 01/22/2019  OT End of Session - 01/22/19 1358    Visit Number  7    Number of Visits  16    Date for OT Re-Evaluation  02/12/19    Authorization Type  [employer]    OT Start Time  1100    OT Stop Time  1200    OT Time Calculation (min)  60 min    Activity Tolerance  Patient tolerated treatment well    Behavior During Therapy  [facility] for tasks assessed/performed       Past Medical History:  Diagnosis Date  . Allergic rhinitis   . Anxiety   . Bradycardia 08/27/2016  . Depression   . Kidney stone   . Ureteral calculus, left 07/14/2013    Past Surgical History:  Procedure Laterality Date  . NO PAST SURGERIES      There  were no vitals filed for this visit.  Subjective Assessment - 01/22/19 1358    Currently in Pain?  No/denies       S: "I am still looking for a healthy way to vent about my marital problems"  O:Continued stress management education given from previous session. Coping strategies taught include: relaxation based- deep breathing, counting to 10, taking a 1 minute vacation, acceptance, stress balls, relaxation audio/video, visual/mental imagery. Positive mental attitude- gratitude, acceptance, cognitive reframing, positive self talk, anger management.  A: Pt presents with flat and depressed affect this date, stating he is irritable and cannot see because he misplaced his glasses and feels as if he is getting sick. He also endorses depressed feelings due to poor sleep and a family argument last PM. Pt states his "day and night was so bad he does not have any memory of the topic yesterday". Recapped information learned from previous session, pt in understanding. Pt shares that he would like to implement counting as a form of mental distraction from negative thoughts. He also shares how he does not want to share his marital problems with his friends/family, and would like to attempt a healthy outlet. Pt as committed to journaling for this sake. Pt also shares how music has been helpful in the past, for he used to play in a band. He now uses music as stress relief and to bond with his  daughter.  P: OT Group will be x4 per week while pt in PHP.                   OT Education - 01/22/19 1358    Education Details  cont education given on stress management    Person(s) Educated  Patient    Methods  Explanation;Handout    Comprehension  Verbalized understanding       OT Short Term Goals - 01/15/19 1714      OT SHORT TERM GOAL #1   Title  Pt will be educated on strategies to improve psychosocial skills needed to participate fully in all daily, work, and leisure activities    Time  4     Period  Weeks    Status  On-going    Target Date  02/12/19      OT SHORT TERM GOAL #2   Title  Pt will apply psychosocial skills and coping mechanisms to daily activities in order to function independently and reintegrate into community    Time  4    Period  Weeks    Status  On-going    Target Date  02/12/19      OT SHORT TERM GOAL #3   Title  Pt will recall and/or apply 1-3 sleep hygiene strategies to improve function in BADL routine upon reintegrating into community    Time  4    Period  Weeks    Status  On-going    Target Date  02/12/19      OT SHORT TERM GOAL #4   Title  Pt will engage in goal setting to improve functional BADL/IADL routine upon reintegrating into community    Time  4    Period  Weeks    Status  On-going    Target Date  02/12/19               Plan - 01/22/19 1359    Occupational performance deficits (Please refer to evaluation for details):  ADL's;IADL's;Rest and Sleep;Work;Leisure;Social Participation    Psychosocial Skills  Coping Strategies;Habits;Routines and Behaviors;Environmental  Adaptations;Interpersonal Interaction       Patient will benefit from skilled therapeutic intervention in order to improve the following deficits and impairments:  Psychosocial Skills  Visit Diagnosis: MDD (major depressive disorder), recurrent severe, without psychosis (HCC)  Difficulty coping    Problem List Patient Active Problem List   Diagnosis Date Noted  . Current severe episode of major depressive disorder without psychotic features without prior episode (HCC) 01/10/2019  . Suicidal ideations 01/10/2019  . Marijuana smoker, continuous 01/10/2019  . Sinus bradycardia 01/10/2019  . Family history of colon cancer 01/10/2019  . Cigarette nicotine dependence without complication 01/10/2019  . Bradycardia 08/27/2016  . Ureteral calculus, left 07/14/2013  . Microhematuria 06/19/2013  . Encounter for preventative adult health care exam with abnormal  findings 07/10/2011  . Allergic conjunctivitis 01/29/2009  . Allergic rhinitis 01/29/2009   [name], MSOT, OTR/L Behavioral Health OT/ Acute Relief OT PHP Office: [phone]  [name] 01/22/2019, 2:01 PM  [facility] PARTIAL HOSPITALIZATION PROGRAM [address] [place], [place], [address] Phone: [phone]   Fax:  [phone]  Name: Vahin Carles MRN: [id number] Date of Birth: 1991/06/08

## 2019-01-22 NOTE — Progress Notes (Signed)
Met with patient through virtual video conference to assess his status with medications and use of marijuana.  Patient reported he used his last marijuana the previous night, now out and going to try medications.  States he plans to not return to using marijuana to relax so he too his first Hydroxzyine at 10:20am this morning.  Patient reported he had a real good day Sunday that carried over into Monday but then for whatever reason starting "going down".  States he slept 6-7 hours Sunday night but then did not go to sleep today until around 7 am.  States he woke up late and was a few minutes late joining group.  Reports his mind was ruminating on thoughts the previous night so he tried to watch u-tube videos to distract himself but then "I fell into the u-tube hole and was on there for about 4 hours".  Patient reported then playing video games and did not lay down until around 6:30am.  Discussed patients status with starting medications and encouraged him to stick with them and to give them a chance to help without returning to use.  Patient stated this was his plan and reviewed risks and benefits of medications.  Patient denied any suicidal or homicidal ideations, no auditory or visual hallucinations and no plan or intent to want to harm self or others at this time.  Patient rated his current level of depression a 7, anxiety a 9 on a scale of 0-10 with 0 being none and 10 the worst he could manage.  Patient agreed with plan to let this nurse know or PHP staff if any worsening of symptoms or problems with medications now that he is willing to give them a try.  Patient stated plan to not return to use of marijuana to keep relaxed and will report any problems with sleep, mood or thoughts.  Patient reports plan to practice better sleep hygiene today to try to get back on a schedule for sleep and will let us know if sleep issues continue.

## 2019-01-22 NOTE — Progress Notes (Signed)
Virtual Visit via Telephone Note  I connected with Blake Walsh on 01/23/19 at  9:00 AM EDT by telephone and verified that I am speaking with the correct person using two identifiers.   I discussed the limitations, risks, security and privacy concerns of performing an evaluation and management service by telephone and the availability of in person appointments. I also discussed with the patient that there may be a patient responsible charge related to this service. The patient expressed understanding and agreed to proceed.    I discussed the assessment and treatment plan with the patient. The patient was provided an opportunity to ask questions and all were answered. The patient agreed with the plan and demonstrated an understanding of the instructions.   The patient was advised to call back or seek an in-person evaluation if the symptoms worsen or if the condition fails to improve as anticipated.  I provided 10 minutes of non-face-to-face time during this encounter.   [name], NP   [facility] MD/PA/NP OP Progress Note  01/23/2019 9:16 PM Blake Walsh  MRN:  [id number]  Evaluation: Blake Walsh reported " I am off to a late start this morning".  Denying suicidal or homicidal ideations.  Denies auditory or visual hallucinations.  Patient reports he has not started medications due to current marijuana supply. Patient was ininiated on   Patient reports he will initiate medications after he finishes "stash".  Continues to endorse anxiety.  Rates his anxiety 4/10 with 10 benign the worst. Demitrious is denying depression or depressive symptoms.  Denies any other concerns at this time.  Reports a fair appetite.  States he is resting "okay"  throughout the night.  Support,encouragement and reassurance was provided.   History: Per assessment note: Blake Walsh is a 28 y.o. caucasian male presents with depression and anxeity.  Patient reports impulsive and irritably mood when he gets angry or upset.  States after he has a  "angry episode" he then  starts to feel shameful and guilt and then will take it out on his self. Reported banging head against the wall. " I feel like I could just run through a brick wall."  Stated he had seen a therapist in the past however is followed by his primary care provider [name].  Patient reports daily use of marijuana. Stated PCP wrote for medication however has never picked up medication.   Visit Diagnosis:    ICD-10-CM   1. MDD (major depressive disorder), recurrent severe, without psychosis (HCC) F33.2     Past Psychiatric History:   Past Medical History:  Past Medical History:  Diagnosis Date  . Allergic rhinitis   . Anxiety   . Bradycardia 08/27/2016  . Depression   . Kidney stone   . Ureteral calculus, left 07/14/2013    Past Surgical History:  Procedure Laterality Date  . NO PAST SURGERIES      Family Psychiatric History:   Family History:  Family History  Problem Relation Age of Onset  . Hyperlipidemia Father   . Hypertension Maternal Grandfather   . Colon cancer Paternal Grandfather   . Hypertension Paternal Grandfather   . Alcohol abuse Paternal Uncle   . Alcohol abuse Paternal Uncle     Social History:  Social History   Socioeconomic History  . Marital status: Married    Spouse name: Not on file  . Number of children: Not on file  . Years of education: 78  . Highest education level: Not on file  Occupational History  .  Not on file  Social Needs  . Financial resource strain: Not very hard  . Food insecurity:    Worry: Never true    Inability: Never true  . Transportation needs:    Medical: No    Non-medical: No  Tobacco Use  . Smoking status: Current Every Day Smoker    Packs/day: 0.50    Years: 9.00    Pack years: 4.50    Types: Cigarettes    Start date: 11/13/2012  . Smokeless tobacco: Never Used  Substance and Sexual Activity  . Alcohol use: Not Currently  . Drug use: Yes    Types: Marijuana    Comment: 1g/day  01/10/19  . Sexual activity: Yes    Partners: Female    Birth control/protection: Condom  Lifestyle  . Physical activity:    Days per week: 2 days    Minutes per session: 30 min  . Stress: Rather much  Relationships  . Social connections:    Talks on phone: Once a week    Gets together: Never    Attends religious service: Never    Active member of club or organization: Yes    Attends meetings of clubs or organizations: More than 4 times per year    Relationship status: Married  Other Topics Concern  . Not on file  Social History Narrative  . Not on file    Allergies: No Known Allergies  Metabolic Disorder Labs: No results found for: HGBA1C, MPG No results found for: PROLACTIN Lab Results  Component Value Date   CHOL 141 01/12/2016   TRIG 52.0 01/12/2016   HDL 46.90 01/12/2016   CHOLHDL 3 01/12/2016   VLDL 10.4 01/12/2016   LDLCALC 83 01/12/2016   Lab Results  Component Value Date   TSH 1.06 01/12/2016    Therapeutic Level Labs: No results found for: LITHIUM No results found for: VALPROATE No components found for:  CBMZ  Current Medications: Current Outpatient Medications  Medication Sig Dispense Refill  . hydrOXYzine (VISTARIL) 25 MG capsule Take 1 capsule (25 mg total) by mouth 3 (three) times daily as needed for anxiety. 30 capsule 0  . sertraline (ZOLOFT) 25 MG tablet Take 1 tablet (25 mg total) by mouth daily for 2 days, THEN 2 tablets (50 mg total) daily for 30 days. 62 tablet 0  . traZODone (DESYREL) 50 MG tablet Take 1 tablet (50 mg total) by mouth at bedtime. 30 tablet 0   No current facility-administered medications for this visit.      Musculoskeletal: Strength & Muscle Tone: N/A Gait & Station: N/A Patient leans: N/A  Psychiatric Specialty Exam: ROS  There were no vitals taken for this visit.There is no height or weight on file to calculate BMI.  General Appearance: NA  Eye Contact:  NA  Speech:  Clear and Coherent  Volume:  Normal   Mood:  Anxious and Depressed  Affect:  NA  Thought Process:  Coherent  Orientation:  Full (Time, Place, and Person)  Thought Content: WDL   Suicidal Thoughts:  No  Homicidal Thoughts:  No  Memory:  Immediate;   Fair Recent;   Fair  Judgement:  Fair  Insight:  Fair  Psychomotor Activity:  NA  Concentration:  Concentration: Fair  Recall:  [employer] of Knowledge: Fair  Language: Fair  Akathisia:  NA  Handed:    AIMS (if indicated):   Assets:  Desire for Improvement Resilience Social Support  ADL's:  Intact  Cognition: WNL  Sleep:  Fair   Screenings: GAD-7     [facility] [employer]  Total GAD-7 Score  17    PHQ2-9     [facility] [place]  PHQ-2 Total Score  5  PHQ-9 Total Score  22       Assessment and Plan:  Continue partial hospitalization program Patient reports he will start taking Zoloft 25 mg and trazodone 50 mg p.o. as needed nightly later this week.  Treatment plan was reviewed and agreed upon by NP T. Quetzalli Clos and patient Jan FiremanKyle K's need for continued group services.   Oneta Rackanika N Lebaron Bautch, NP 01/23/2019, 9:16 PM

## 2019-01-23 ENCOUNTER — Encounter (HOSPITAL_COMMUNITY): Payer: Self-pay | Admitting: Licensed Clinical Social Worker

## 2019-01-23 ENCOUNTER — Other Ambulatory Visit (HOSPITAL_COMMUNITY): Payer: 59

## 2019-01-23 ENCOUNTER — Other Ambulatory Visit (HOSPITAL_COMMUNITY): Payer: 59 | Admitting: Licensed Clinical Social Worker

## 2019-01-23 ENCOUNTER — Other Ambulatory Visit: Payer: Self-pay

## 2019-01-23 DIAGNOSIS — F332 Major depressive disorder, recurrent severe without psychotic features: Secondary | ICD-10-CM | POA: Diagnosis not present

## 2019-01-23 NOTE — Psych (Signed)
Virtual Visit via Video Note  I connected with Blake Walsh on 01/21/19 at  9:00 AM EDT by a video enabled telemedicine application and verified that I am speaking with the correct person using two identifiers.   I discussed the limitations of evaluation and management by telemedicine and the availability of in person appointments. The patient expressed understanding and agreed to proceed.    I discussed the assessment and treatment plan with the patient. The patient was provided an opportunity to ask questions and all were answered. The patient agreed with the plan and demonstrated an understanding of the instructions.   The patient was advised to call back or seek an in-person evaluation if the symptoms worsen or if the condition fails to improve as anticipated.  Pt was provided 240 minutes of non-face-to-face time during this encounter.   Donia Guiles, LCSW    [facility] PHP THERAPIST PROGRESS NOTE  Blake Walsh [id number]  Session Time: 9:00 - 10:00  Participation Level: Active  Behavioral Response: CasualAlertDepressed  Type of Therapy: Group Therapy  Treatment Goals addressed: Coping  Interventions: CBT, DBT, Supportive and Reframing  Summary: Clinician led check-in regarding current stressors and situation, and review of patient completed daily inventory. Clinician utilized active listening and empathetic response and validated patient emotions. Clinician facilitated processing group on pertinent issues  Therapist Response: Blake Walsh is a 28 y.o. male who presents with depression symptoms.  Patient arrived within time allowed and reports that he is feeling "pretty good."Patient rates his mood at [facility] a scale of 1-10 with 10 being great. Pt reports he had a "pretty good weekend" and has been in a good mood. Pt states he spent the day with his daughter on Saturday and had a family day on Sunday. Pt shares it was his wife's birthday on Sunday and he tried to "be considerate to  her like I would be to me." Pt reports he felt proud of himself that he was able to be a "good" partner. Pt shares one low part of the weekend was when he went to the grocery store. Pt reports having anxiety from all the people and from the health precautions. Pt reports becoming overwhelmed and frustrated, so he called his wife and she helped him calm down. Pt states it was a "big step" to reach out when he was struggling and he was proud of himself for "breaking the cycle." Pt able to process. Patient engaged in discussion.      Session Time: 10:00 -11:00  Participation Level: Active  Behavioral Response: CasualAlertDepressed  Type of Therapy: Group Therapy, psychoeducation, psychotherapy  Treatment Goals addressed: Coping  Interventions: CBT, DBT, Solution Focused, Supportive and Reframing  Summary:  Cln led discussion on healthy decision making and how grace, substitution, and black and white thinking have a role in making healthier decisions. Cln discussed thinking of decisions on a spectrum and trying to move closer to healthier decisions when able.   Therapist Response: Patient shares he struggles with making healthier decisions when it comes to food. 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 Pt states he has been eating whatever he wants, eating a lot of junk food and sweets the past 2 weeks and not working out consistently. Pt uses self-judgment language. Pt is able to access grace  in terms of the life changes that may contribute to this alter in routine such as working from home, not working, the stay at home order, etc. Pt responds well to the idea of substitution and moving incrementally closer to a bigger health goal. Pt able to recognize black and white thinking in terms of  eating all healthy or eating whatever he wants and brainstorms ways to address it.       Session Time: 11:00 - 12:00   Participation Level: Active   Behavioral Response: CasualAlertDepressed   Type of Therapy: Group Therapy, OT   Treatment Goals addressed: Coping   Interventions: Psychosocial skills training, Supportive,    Summary:  Occupational Therapy group   Therapist Response: Patient engaged in group. See OT note.        Session Time: 12:00- 1:00  Participation Level: Active  Behavioral Response: CasualAlertDepressed  Type of Therapy: Group Therapy, Psychoeducation; Psychotherapy  Treatment Goals addressed: Coping  Interventions: CBT; Solution focused; Supportive; Reframing  Summary: 12:00 - 12:50 Clinician continued topic of cognitive distortions. Cln introduced "catch, challenge, change" model of behavior modification and discussed how increasing comfort in "not knowing" can assist in challenging unhealthy thought patterns. 12:50 -1:00 Clinician led check-out. Clinician assessed for immediate needs, medication compliance and efficacy, and safety concerns   Therapist Response: Patient reports understanding of how living in gray areas can benefit him and relates it to his anger and communications with his partner. Pt is able to share past examples in which needing/wanting an immediate answer or conclusion caused bigger issues later for he and his partner.   At check-out, patient rates his mood at a 7 on a scale of 1-10 with 10 being great. Patient reports afternoon plans of mowing the lawn and eating. Patient demonstrates some progress as evidenced by increased mood and taking positive coping steps over the weekend. Pt denies SI/HI at end of group.     Suicidal/Homicidal: Nowithout intent/plan  Plan: Pt will continue in PHP while working to decrease depression and SI thoughts, and increase ability to manage symptoms in a healthy manner when they arise.      Diagnosis: MDD (major depressive disorder), recurrent severe, without psychosis (HCC) [F33.2]    1. MDD (major depressive disorder), recurrent severe, without psychosis (HCC)       Donia GuilesJenny Anitha Kreiser, LCSW 01/23/2019

## 2019-01-23 NOTE — Psych (Signed)
Virtual Visit via Video Note  I connected with Blake Walsh on 01/23/19 at  9:00 AM EDT by a video enabled telemedicine application and verified that I am speaking with the correct person using two identifiers.   I discussed the limitations of evaluation and management by telemedicine and the availability of in person appointments. The patient expressed understanding and agreed to proceed.    I discussed the assessment and treatment plan with the patient. The patient was provided an opportunity to ask questions and all were answered. The patient agreed with the plan and demonstrated an understanding of the instructions.   The patient was advised to call back or seek an in-person evaluation if the symptoms worsen or if the condition fails to improve as anticipated.  Pt was provided 240 minutes of non-face-to-face time during this encounter.   Blake GuilesJenny Jamyia Fortune, LCSW    [facility] BH PHP THERAPIST PROGRESS NOTE  Blake Walsh [id number]  Session Time: 9:00 - 10:00  Participation Level: Active  Behavioral Response: CasualAlertDepressed  Type of Therapy: Group Therapy  Treatment Goals addressed: Coping  Interventions: CBT, DBT, Supportive and Reframing  Summary: Clinician led check-in regarding current stressors and situation, and review of patient completed daily inventory. Clinician utilized active listening and empathetic response and validated patient emotions. Clinician facilitated processing group on pertinent issues  Therapist Response: Blake Walsh is a 28 y.o. male who presents with depression symptoms.  Patient arrived within time allowed and reports that he is feeling "a lot better."[facility]10 with 10 being great. Pt reports he slept 14 hours yesterday and "slept so good." Pt shares that after group he ate and went to couple's counseling, which was productive and went well. Pt states he attempted a nap when he returned home but woke up at midnight and  went back to bed. Pt reports he took his new medications for the first time yesterday, except for the sleep medication. Patient engaged in discussion.      Session Time: 10:00 -11:00  Participation Level: Active  Behavioral Response: CasualAlertDepressed  Type of Therapy: Group Therapy, psychoeducation, psychotherapy  Treatment Goals addressed: Coping  Interventions: CBT, DBT, Solution Focused, Supportive and Reframing  Summary:  Cln led discussion on difficult boundaries and how to keep the focus on a defined sense of self rather than the demands of others. Group members shared ways they have struggled with boundaries in different relationships and problem solved ways to reframe and re-center.   Therapist Response: Patient reports setting boundaries with difficult people is "tricky" for him and he has experienced this with his father and partner. Pt shares he has had some recent success and it comes from recognizing first that "I can do this for me and it's okay." Pt provided feedback to group members.       Session Time: 11:00 -12:00  Participation Level:Active  Behavioral Response:CasualAlertDepressed  Type of Therapy: Group Therapy, psychotherapy  Treatment Goals addressed: Coping  Interventions:Strengths based, reframing, Supportive,   Summary:Spiritual Care group  Therapist Response: Patient engaged in group. See chaplain note.       Session Time: 12:00- 1:00  Participation Level:Active  Behavioral Response:CasualAlertDepressed  Type of Therapy: Group Therapy, Psychoeducation  Treatment Goals addressed: Coping  Interventions:relaxation training; Supportive; Reframing  Summary:12:00 - 12:50: Relaxation group: Cln led group focused on retraining the body's response to stress.  12:50 -1:00 Clinician led check-out. Clinician assessed for immediate needs, medication compliance and efficacy, and safety  concerns  Therapist Response:Pt engaged in activity and discussion. At check-out, patient rates [facility] a scale of 1-10 with 10 being great. Patient reports afternoon plansofworking out, shaving, running errands, and doing therapy exercises with his wife. Patient demonstrates some progress as evidenced byincreased sharing and insight. Patient denies SI/HI/self-harm thoughts at the end of group.   Suicidal/Homicidal: Nowithout intent/plan  Plan: Pt will continue in PHP while working to decrease depression and SI thoughts, and increase ability to manage symptoms in a healthy manner when they arise.     Diagnosis: MDD (major depressive disorder), recurrent severe, without psychosis (HCC) [F33.2]    1. MDD (major depressive disorder), recurrent severe, without psychosis (HCC)       Blake Guiles, LCSW 01/23/2019

## 2019-01-23 NOTE — Psych (Signed)
Virtual Visit via Video Note  I connected with Blake Walsh on 01/22/19 at  9:00 AM EDT by a video enabled telemedicine application and verified that I am speaking with the correct person using two identifiers.   I discussed the limitations of evaluation and management by telemedicine and the availability of in person appointments. The patient expressed understanding and agreed to proceed.    I discussed the assessment and treatment plan with the patient. The patient was provided an opportunity to ask questions and all were answered. The patient agreed with the plan and demonstrated an understanding of the instructions.   The patient was advised to call back or seek an in-person evaluation if the symptoms worsen or if the condition fails to improve as anticipated.  Pt was provided 240 minutes of non-face-to-face time during this encounter.   Donia Guiles, LCSW    [facility] BH PHP THERAPIST PROGRESS NOTE  Archibald Fasnacht [id number]  Session Time: 9:00 - 10:00  Participation Level: Active  Behavioral Response: CasualAlertDepressed  Type of Therapy: Group Therapy  Treatment Goals addressed: Coping  Interventions: CBT, DBT, Supportive and Reframing  Summary: Clinician led check-in regarding current stressors and situation, and review of patient completed daily inventory. Clinician utilized active listening and empathetic response and validated patient emotions. Clinician facilitated processing group on pertinent issues  Therapist Response: Blake Walsh is a 28 y.o. male who presents with depression symptoms.  Patient arrived within time allowed and reports that he is feeling "really bad."Patient rates his mood at a2on a scale of 1-10 with 10 being great. Pt reports he has had a difficult night, sleeping only two hours. Pt shares that his daughter fell out of bed last night which was stressful. Pt reports worry and checking on her often kept him awake for a period of time and then he began to  spiral with negative thoughts which continued to keep him up. Pt shares he and his wife were tense with each other and snappy due to the stress and poor sleep. Pt reports sime self harm thoughts sharing he dragged his nails down his head and face hoping to scratch himself but his nails were not long enough.  Pt shares he spent his time awake trying to use distraction skills and was on YouTube and playing a game. Pt states it worked "okay." Pt states he cannot find his glasses this morning which is prohibiting his ability to see well and is giving him a headache. Pt unable to complete morning sheet due to inability to see. Pt able to process frustrations. Patient engaged in discussion.      Session Time: 10:00 -11:00  Participation Level: Active  Behavioral Response: CasualAlertDepressed  Type of Therapy: Group Therapy, psychoeducation, psychotherapy  Treatment Goals addressed: Coping  Interventions: CBT, DBT, Solution Focused, Supportive and Reframing  Summary:  Cln led discussion on concept of "finding the real me." Group shared ways in which this felt true to them and an endeavor that they wanted to engage in or have been engaging in. Cln offered alternate view of "rediscovery" or casting off the negative, distorted, unhelpful messages or patterns that we have been engaging with as way to differentiate that what is happening to Blake Walsh might not be something we want to carry with Blake Walsh, however it is not a deviation or something separate from who we are or a separate part of our story.   Therapist Response: Patient states he struggles with talking about who he is as a person. Pt reports "  I feel like it gets cloudy really quick and starts to feel like I don't know who I am at all." Pt processed discussion stating "I think for me, I can start to look  at a behavior or pattern and ask, is this something I want to continue to be part of me or my life? And try to get rid of it if I don't. Maybe if  I keep my focus on now, it won't get so cloudy."  Pt accepted and provided feedback.       Session Time: 11:00 - 12:00   Participation Level: Active   Behavioral Response: CasualAlertDepressed   Type of Therapy: Group Therapy, OT   Treatment Goals addressed: Coping   Interventions: Psychosocial skills training, Supportive,    Summary:  Occupational Therapy group   Therapist Response: Patient engaged in group. See OT note.        Session Time: 12:00- 1:00  Participation Level: Active  Behavioral Response: CasualAlertDepressed  Type of Therapy: Group Therapy, Psychoeducation; Psychotherapy  Treatment Goals addressed: Coping  Interventions: CBT; Solution focused; Supportive; Reframing  Summary: 12:00 - 12:50 Clinician continued topic of cognitive distortions. Cln led review and provided education on handout "Socratic Questions." Group discussed ways to utilize socratic questions to challenge unhealthy thought patterns and practiced with examples.  12:50 -1:00 Clinician led check-out. Clinician assessed for immediate needs, medication compliance and efficacy, and safety concerns   Therapist Response: Patient reports understanding of socratic questions and participates in challenging examples, talking through an issue with his partner.   At check-out, patient rates his mood at a 3 on a scale of 1-10 with 10 being great. Patient reports afternoon plans of going to a couple's counseling session, eating, and catching up on sleep. Patient demonstrates some progress as evidenced by utilizing skills when struggling last night. Pt denies SI/HI at end of group.     Suicidal/Homicidal: Nowithout intent/plan  Plan: Pt will continue in PHP while working to decrease depression and SI thoughts, and increase ability to manage symptoms in a healthy manner when they arise.     Diagnosis: MDD (major depressive disorder), recurrent severe, without psychosis (HCC) [F33.2]    1. MDD  (major depressive disorder), recurrent severe, without psychosis (HCC)       Donia GuilesJenny Nori Winegar, LCSW 01/23/2019

## 2019-01-24 ENCOUNTER — Other Ambulatory Visit (HOSPITAL_COMMUNITY): Payer: 59 | Admitting: Licensed Clinical Social Worker

## 2019-01-24 ENCOUNTER — Encounter (HOSPITAL_COMMUNITY): Payer: Self-pay

## 2019-01-24 ENCOUNTER — Other Ambulatory Visit: Payer: Self-pay

## 2019-01-24 ENCOUNTER — Other Ambulatory Visit (HOSPITAL_COMMUNITY): Payer: 59 | Admitting: Occupational Therapy

## 2019-01-24 DIAGNOSIS — F332 Major depressive disorder, recurrent severe without psychotic features: Secondary | ICD-10-CM

## 2019-01-24 DIAGNOSIS — R4589 Other symptoms and signs involving emotional state: Secondary | ICD-10-CM

## 2019-01-24 NOTE — Therapy (Signed)
[facility] PART[facility] [address] [place], [place], [address] Phone: [phone]   Fax:  [phone]  Occupational Therapy Treatment  Patient Details  Name: Blake Walsh MRN: [id number] Date of Birth: 1991/02/24 Referring Provider (OT): Hillery Jacksanika Lewis, NP  Virtual Visit via Video Note  I connected with Blake Walsh on 01/24/19 at  8:00 AM EDT by a video enabled telemedicine application and verified that I am speaking with the correct person using two identifiers.   I discussed the limitations of evaluation and management by telemedicine and the availability of in person appointments. The patient expressed understanding and agreed to proceed.  I discussed the assessment and treatment plan with the patient. The patient was provided an opportunity to ask questions and all were answered. The patient agreed with the plan and demonstrated an understanding of the instructions.   The patient was advised to call back or seek an in-person evaluation if the symptoms worsen or if the condition fails to improve as anticipated.  I provided 60 minutes of non-face-to-face time during this encounter.  Dalphine HandingKaylee Coal Nearhood, MSOT, OTR/L Behavioral Health OT/ Acute Relief OT PHP Office: [phone]  Dalphine HandingKaylee Skie [place], [place]    Encounter Date: 01/24/2019  OT End of Session - 01/24/19 1633    Visit Number  8    Number of Visits  16    Date for OT Re-Evaluation  02/12/19    Authorization Type  [employer]    OT Start Time  1100    OT Stop Time  1200    OT Time Calculation (min)  60 min    Activity Tolerance  Patient tolerated treatment well    Behavior During Therapy  [facility] for tasks assessed/performed       Past Medical History:  Diagnosis Date  . Allergic rhinitis   . Anxiety   . Bradycardia 08/27/2016  . Depression   . Kidney stone   . Ureteral calculus, left 07/14/2013    Past Surgical History:  Procedure Laterality Date  . NO PAST SURGERIES      There  were no vitals filed for this visit.  Subjective Assessment - 01/24/19 1633    Currently in Pain?  No/denies       S: "This are so applicable to my current state in marriage"   O: Education given on Surveyor, mineralsverbal communication skills this date. Fair fighting rules discussed in reference to a variety of relationships. Pt asked to apply certain rules to a current situation in life. Continued education will be given next session.  A: Pt presents to group with blunted affect, brighter than previous dates. He shares that his topic is very much applicable to his current state and marriage. He is very interested and motivated in sharing examples and easy to fix current communication mishaps. He shares that he needs to focus on figuring out why he is upset and label that emotion to prevent further discord in arguments. He gave financial examples from his marriage to compare. Pt very eager for continued education.  P: OT group will be x3 per week while pt in PHP.                      OT Education - 01/24/19 1633    Education Details  education given on communication skills this date    Person(s) Educated  Patient    Methods  Explanation;Handout    Comprehension  Verbalized understanding       OT Short  Term Goals - 01/15/19 1714      OT SHORT TERM GOAL #1   Title  Pt will be educated on strategies to improve psychosocial skills needed to participate fully in all daily, work, and leisure activities    Time  4    Period  Weeks    Status  On-going    Target Date  02/12/19      OT SHORT TERM GOAL #2   Title  Pt will apply psychosocial skills and coping mechanisms to daily activities in order to function independently and reintegrate into community    Time  4    Period  Weeks    Status  On-going    Target Date  02/12/19      OT SHORT TERM GOAL #3   Title  Pt will recall and/or apply 1-3 sleep hygiene strategies to improve function in BADL routine upon reintegrating into  community    Time  4    Period  Weeks    Status  On-going    Target Date  02/12/19      OT SHORT TERM GOAL #4   Title  Pt will engage in goal setting to improve functional BADL/IADL routine upon reintegrating into community    Time  4    Period  Weeks    Status  On-going    Target Date  02/12/19               Plan - 01/24/19 1634    Occupational performance deficits (Please refer to evaluation for details):  ADL's;IADL's;Rest and Sleep;Work;Leisure;Social Participation    Psychosocial Skills  Coping Strategies;Habits;Routines and Behaviors;Environmental  Adaptations;Interpersonal Interaction       Patient will benefit from skilled therapeutic intervention in order to improve the following deficits and impairments:  Psychosocial Skills  Visit Diagnosis: MDD (major depressive disorder), recurrent severe, without psychosis (HCC)  Difficulty coping    Problem List Patient Active Problem List   Diagnosis Date Noted  . Current severe episode of major depressive disorder without psychotic features without prior episode (HCC) 01/10/2019  . Suicidal ideations 01/10/2019  . Marijuana smoker, continuous 01/10/2019  . Sinus bradycardia 01/10/2019  . Family history of colon cancer 01/10/2019  . Cigarette nicotine dependence without complication 01/10/2019  . Bradycardia 08/27/2016  . Ureteral calculus, left 07/14/2013  . Microhematuria 06/19/2013  . Encounter for preventative adult health care exam with abnormal findings 07/10/2011  . Allergic conjunctivitis 01/29/2009  . Allergic rhinitis 01/29/2009   [name], MSOT, OTR/L Behavioral Health OT/ Acute Relief OT PHP Office: [phone]  [name] 01/24/2019, 4:35 PM  [facility] PARTIAL HOSPITALIZATION PROGRAM [address] [place], [place], [address] Phone: [phone]   Fax:  [phone]  Name: Blake Walsh MRN: [id number] Date of Birth: 1990-11-07

## 2019-01-25 ENCOUNTER — Other Ambulatory Visit (HOSPITAL_COMMUNITY): Payer: 59 | Attending: Psychiatry | Admitting: Licensed Clinical Social Worker

## 2019-01-25 ENCOUNTER — Other Ambulatory Visit: Payer: Self-pay

## 2019-01-25 ENCOUNTER — Encounter (HOSPITAL_COMMUNITY): Payer: Self-pay

## 2019-01-25 ENCOUNTER — Other Ambulatory Visit (HOSPITAL_COMMUNITY): Payer: 59 | Admitting: Occupational Therapy

## 2019-01-25 DIAGNOSIS — F329 Major depressive disorder, single episode, unspecified: Secondary | ICD-10-CM | POA: Diagnosis present

## 2019-01-25 DIAGNOSIS — F419 Anxiety disorder, unspecified: Secondary | ICD-10-CM | POA: Insufficient documentation

## 2019-01-25 DIAGNOSIS — F332 Major depressive disorder, recurrent severe without psychotic features: Secondary | ICD-10-CM | POA: Diagnosis not present

## 2019-01-25 DIAGNOSIS — F1290 Cannabis use, unspecified, uncomplicated: Secondary | ICD-10-CM | POA: Diagnosis not present

## 2019-01-25 DIAGNOSIS — R4589 Other symptoms and signs involving emotional state: Secondary | ICD-10-CM

## 2019-01-25 NOTE — Psych (Signed)
Virtual Visit via Video Note  I connected with Blake Walsh on 01/24/19 at  9:00 AM EDT by a video enabled telemedicine application and verified that I am speaking with the correct person using two identifiers.   I discussed the limitations of evaluation and management by telemedicine and the availability of in person appointments. The patient expressed understanding and agreed to proceed.    I discussed the assessment and treatment plan with the patient. The patient was provided an opportunity to ask questions and all were answered. The patient agreed with the plan and demonstrated an understanding of the instructions.   The patient was advised to call back or seek an in-person evaluation if the symptoms worsen or if the condition fails to improve as anticipated.  Pt was provided 240 minutes of non-face-to-face time during this encounter.   Donia Guiles, LCSW    [facility] BH PHP THERAPIST PROGRESS NOTE  Blake Walsh [id number]  Session Time: 9:00 - 10:00  Participation Level: Active  Behavioral Response: CasualAlertDepressed  Type of Therapy: Group Therapy  Treatment Goals addressed: Coping  Interventions: CBT, DBT, Supportive and Reframing  Summary: Clinician led check-in regarding current stressors and situation, and review of patient completed daily inventory. Clinician utilized active listening and empathetic response and validated patient emotions. Clinician facilitated processing group on pertinent issues  Therapist Response: Blake Walsh is a 28 y.o. male who presents with depression symptoms.  Patient arrived within time allowed and reports that he is feeling "okay today."Patient rates his mood at a5 on a scale of 1-10 with 10 being great. Pt reports he has been up since 5AM with his daughter who has been fussy. Pt shares he struggles with his daughter when she has these behaviors, however is trying to let his wife sleep, so is doing the best he can. Pt states feeling "kinda  proud" that he is making it work. Pt shares that yesterday he saw friends and smoked pot. Pt reports he is "not sure how he feels about it" because he stopped to give medication a chance and went less than 2 days without smoking. Pt states he did bring some home, however plans to not use it and continue with medication instead. Pt shares last night he was able to have quality time with his wife which was restorative for them. Pt struggles to connect knowledge and behaviors. Pt able to process. Patient engaged in discussion.      Session Time: 10:00 -11:00  Participation Level: Active  Behavioral Response: CasualAlertDepressed  Type of Therapy: Group Therapy, psychoeducation, psychotherapy  Treatment Goals addressed: Coping  Interventions: CBT, DBT, Solution Focused, Supportive and Reframing  Summary:  Cln led discussion on moving from "why" to "what now." Group discussed the ways in which focusing on "why?" can cause disruptions in progress and create barriers to moving forward.   Therapist Response: Patient provided active feedback and support to group members, however lacked insight into the way this presented in his own life.       Session Time: 11:00 - 12:00  Participation Level:Active  Behavioral Response:CasualAlertDepressed  Type of Therapy: Group Therapy, OT  Treatment Goals addressed: Coping  Interventions:Psychosocial skills training, Supportive,   Summary:Occupational Therapy group  Therapist Response: Patient engaged in group. See OT note.       Session Time: 12:00- 1:00  Participation Level: Active  Behavioral Response: CasualAlertDepressed  Type of Therapy: Group Therapy, Psychoeducation; Psychotherapy  Treatment Goals addressed: Coping  Interventions: CBT; Solution focused; Supportive; Reframing  Summary: 12:00 -  12:50 Cln introduced topic of boundaries. Cln provided education on what boundaries are and how  they affect our lives. Cln led discussion on how to move boundaries from a passive to an active process in our lives.  12:50 -1:00 Clinician led check-out. Clinician assessed for immediate needs, medication compliance and efficacy, and safety concerns   Therapist Response: Patient states understanding of boundaries and shares he can see places where he does well with boundaries and others that need a lot of improvement.  At check-out, patient rates his mood at a 6 on a scale of 1-10 with 10 being great. Patient reports afternoon plans of eating, doing chores around the house, and "see what happens." Patient demonstrates some progress as evidenced by increased accountability at home. Pt denies SI/HI at end of group.     Suicidal/Homicidal: Nowithout intent/plan  Plan: Pt will continue in PHP while working to decrease depression and SI thoughts, and increase ability to manage symptoms in a healthy manner when they arise.     Diagnosis: MDD (major depressive disorder), recurrent severe, without psychosis (HCC) [F33.2]    1. MDD (major depressive disorder), recurrent severe, without psychosis (HCC)       Donia GuilesJenny Blake Mailhot, LCSW 01/25/2019

## 2019-01-25 NOTE — Therapy (Signed)
[facility] PARTIAL HOSPITALIZATION PROGRAM [address] [place], [place], [address] Phone: [phone]   Fax:  [phone]  Occupational Therapy Treatment  Patient Details  Name: Blake Walsh MRN: [id number] Date of Birth: 02/24/1991 Referring Provider (OT): [name], NP  Virtual Visit via Video Note  I connected with Blake Walsh on 01/25/19 at  8:00 AM EDT by a video enabled telemedicine application and verified that I am speaking with the correct person using two identifiers.   I discussed the limitations of evaluation and management by telemedicine and the availability of in person appointments. The patient expressed understanding and agreed to proceed.  I discussed the assessment and treatment plan with the patient. The patient was provided an opportunity to ask questions and all were answered. The patient agreed with the plan and demonstrated an understanding of the instructions.   The patient was advised to call back or seek an in-person evaluation if the symptoms worsen or if the condition fails to improve as anticipated.  I provided 60 minutes of non-face-to-face time during this encounter.  [name], MSOT, OTR/L Behavioral Health OT/ Acute Relief OT PHP Office: [phone]  [name], [place]    Encounter Date: 01/25/2019  OT End of Session - 01/25/19 1341    Visit Number  9    Number of Visits  16    Date for OT Re-Evaluation  02/12/19    Authorization Type  [employer]    OT Start Time  1100    OT Stop Time  1200    OT Time Calculation (min)  60 min    Activity Tolerance  Patient tolerated treatment well    Behavior During Therapy  [facility] for tasks assessed/performed       Past Medical History:  Diagnosis Date  . Allergic rhinitis   . Anxiety   . Bradycardia 08/27/2016  . Depression   . Kidney stone   . Ureteral calculus, left 07/14/2013    Past Surgical History:  Procedure Laterality Date  . NO PAST SURGERIES      There  were no vitals filed for this visit.  Subjective Assessment - 01/25/19 1341    Currently in Pain?  No/denies        S: "A lot of these can be applicable to my marriage"  O: Continued education given on fair fighting rules from previous date to improve communication with social participation. Pt asked to share personal experiences and examples, as well as what they would like to improve.  A: Pt presents with flat affect, engaged and participatory throughout session. Pt shares that he would like to improve how often he stonewalls in arguments. He shares he can do this by taking a break in conversation or by reminding himself how damaging this is to conversation. Pt shares how this is a common defense mechanism, because "it is easier that way sometimes" but also recognizes the detriments are greater.  P: OT group will be x3 per week while pt in PHP.                   OT Education - 01/25/19 1341    Education Details  continued education given on communication skills    Person(s) Educated  Patient    Methods  Explanation;Handout    Comprehension  Verbalized understanding       OT Short Term Goals - 01/15/19 1714      OT SHORT TERM GOAL #1   Title  Pt will  be educated on strategies to improve psychosocial skills needed to participate fully in all daily, work, and leisure activities    Time  4    Period  Weeks    Status  On-going    Target Date  02/12/19      OT SHORT TERM GOAL #2   Title  Pt will apply psychosocial skills and coping mechanisms to daily activities in order to function independently and reintegrate into community    Time  4    Period  Weeks    Status  On-going    Target Date  02/12/19      OT SHORT TERM GOAL #3   Title  Pt will recall and/or apply 1-3 sleep hygiene strategies to improve function in BADL routine upon reintegrating into community    Time  4    Period  Weeks    Status  On-going    Target Date  02/12/19      OT SHORT TERM GOAL #4    Title  Pt will engage in goal setting to improve functional BADL/IADL routine upon reintegrating into community    Time  4    Period  Weeks    Status  On-going    Target Date  02/12/19               Plan - 01/25/19 1348    OT Frequency  3x / week       Patient will benefit from skilled therapeutic intervention in order to improve the following deficits and impairments:  Psychosocial Skills  Visit Diagnosis: MDD (major depressive disorder), recurrent severe, without psychosis (HCC)  Difficulty coping    Problem List Patient Active Problem List   Diagnosis Date Noted  . Current severe episode of major depressive disorder without psychotic features without prior episode (HCC) 01/10/2019  . Suicidal ideations 01/10/2019  . Marijuana smoker, continuous 01/10/2019  . Sinus bradycardia 01/10/2019  . Family history of colon cancer 01/10/2019  . Cigarette nicotine dependence without complication 01/10/2019  . Bradycardia 08/27/2016  . Ureteral calculus, left 07/14/2013  . Microhematuria 06/19/2013  . Encounter for preventative adult health care exam with abnormal findings 07/10/2011  . Allergic conjunctivitis 01/29/2009  . Allergic rhinitis 01/29/2009   Dalphine HandingKaylee Gerhart Ruggieri, MSOT, OTR/L Behavioral Health OT/ Acute Relief OT PHP Office: [phone]  Dalphine HandingKaylee Carley Strickling 01/25/2019, 1:48 PM  [facility] PARTIAL HOSPITALIZATION PROGRAM [address] [place], [place], [address] Phone: [phone]   Fax:  [phone]  Name: Blake Walsh MRN: [id number] Date of Birth: 12/23/1990

## 2019-01-28 ENCOUNTER — Other Ambulatory Visit (HOSPITAL_COMMUNITY): Payer: 59

## 2019-01-28 ENCOUNTER — Other Ambulatory Visit: Payer: Self-pay

## 2019-01-28 ENCOUNTER — Other Ambulatory Visit (HOSPITAL_COMMUNITY): Payer: 59 | Admitting: Licensed Clinical Social Worker

## 2019-01-28 DIAGNOSIS — F332 Major depressive disorder, recurrent severe without psychotic features: Secondary | ICD-10-CM | POA: Diagnosis not present

## 2019-01-29 ENCOUNTER — Other Ambulatory Visit: Payer: Self-pay

## 2019-01-29 ENCOUNTER — Other Ambulatory Visit (HOSPITAL_COMMUNITY): Payer: 59 | Admitting: Occupational Therapy

## 2019-01-29 ENCOUNTER — Other Ambulatory Visit (HOSPITAL_COMMUNITY): Payer: 59 | Admitting: Licensed Clinical Social Worker

## 2019-01-29 ENCOUNTER — Encounter (HOSPITAL_COMMUNITY): Payer: Self-pay

## 2019-01-29 DIAGNOSIS — F332 Major depressive disorder, recurrent severe without psychotic features: Secondary | ICD-10-CM

## 2019-01-29 DIAGNOSIS — R4589 Other symptoms and signs involving emotional state: Secondary | ICD-10-CM

## 2019-01-29 NOTE — Therapy (Signed)
[facility] PARTIAL HOSPITALIZATION PROGRAM [address] [place], [place], [address] Phone: [phone]   Fax:  [phone]  Occupational Therapy Treatment  Patient Details  Name: Blake Walsh MRN: [id number] Date of Birth: 01/19/1991 Referring Provider (OT): Tanika Lewis, NP  Virtual Visit via Video Note  I connected with Blake Walsh on 01/29/19 at  8:00 AM EDT by a video enabled telemedicine application and verified that I am speaking with the correct person using two identifiers.   I discussed the limitations of evaluation and management by telemedicine and the availability of in person appointments. The patient expressed understanding and agreed to proceed.  I discussed the assessment and treatment plan with the patient. The patient was provided an opportunity to ask questions and all were answered. The patient agreed with the plan and demonstrated an understanding of the instructions.   The patient was advised to call back or seek an in-person evaluation if the symptoms worsen or if the condition fails to improve as anticipated.  I provided 60 minutes of non-face-to-face time during this encounter.  Blake Walsh, MSOT, OTR/L Behavioral Health OT/ Acute Relief OT PHP Office: [phone]  Blake Walsh, OT    Encounter Date: 01/29/2019  OT End of Session - 01/29/19 1513    Visit Number  10    Number of Visits  16    Date for OT Re-Evaluation  02/12/19    Authorization Type  [employer]    OT Start Time  1100    OT Stop Time  1200    OT Time Calculation (min)  60 min    Activity Tolerance  Patient tolerated treatment well    Behavior During Therapy  [facility] for tasks assessed/performed       Past Medical History:  Diagnosis Date  . Allergic rhinitis   . Anxiety   . Bradycardia 08/27/2016  . Depression   . Kidney stone   . Ureteral calculus, left 07/14/2013    Past Surgical History:  Procedure Laterality Date  . NO PAST SURGERIES      There  were no vitals filed for this visit.  Subjective Assessment - 01/29/19 1512    Currently in Pain?  No/denies        S: "I have these communication issues with my wife"  O: OT treatment focus on communication skills, with emphasis of session on active listening. Pt received handout to guide understanding of definition of active listening and how to improve skills this date. Pt asked to share personal experiences and examples throughout session.   A: Pt presented to group with blunted affect, engaged and participatory throughout session. Pt received active listening handout, in understanding of ways to improve current practices. Pt shares that his communication issues are mostly with his wife. He shares how he cannot effectively listen because he is just thinking of how to respond. Pt shares he would like to use the skill "restating" to help combat this.   P: Pt provided with communication skills to implement when reintegrating into community dwelling. Pt encouraged to review remainder of skills considering d/c next date.                     OT Education - 01/29/19 1512    Education Details  continued education given on communication skills    Person(s) Educated  Patient    Methods  Explanation;Handout    Comprehension  Verbalized understanding       OT Short Term Goals -   01/29/19 1520      OT SHORT TERM GOAL #1   Title  Pt will be educated on strategies to improve psychosocial skills needed to participate fully in all daily, work, and leisure activities    Time  4    Period  Weeks    Status  Achieved    Target Date  02/12/19      OT SHORT TERM GOAL #2   Title  Pt will apply psychosocial skills and coping mechanisms to daily activities in order to function independently and reintegrate into community    Time  4    Period  Weeks    Status  Achieved    Target Date  02/12/19      OT SHORT TERM GOAL #3   Title  Pt will recall and/or apply 1-3 sleep hygiene  strategies to improve function in BADL routine upon reintegrating into community    Time  4    Period  Weeks    Status  Achieved    Target Date  02/12/19      OT SHORT TERM GOAL #4   Title  Pt will engage in goal setting to improve functional BADL/IADL routine upon reintegrating into community    Time  4    Period  Weeks    Status  Achieved    Target Date  02/12/19               Plan - 01/29/19 1513    Occupational performance deficits (Please refer to evaluation for details):  ADL's;IADL's;Rest and Sleep;Work;Leisure;Social Participation    Psychosocial Skills  Coping Strategies;Habits;Routines and Behaviors;Environmental  Adaptations;Interpersonal Interaction       Patient will benefit from skilled therapeutic intervention in order to improve the following deficits and impairments:  Psychosocial Skills  Visit Diagnosis: MDD (major depressive disorder), recurrent severe, without psychosis (HCC)  Difficulty coping    Problem List Patient Active Problem List   Diagnosis Date Noted  . Current severe episode of major depressive disorder without psychotic features without prior episode (HCC) 01/10/2019  . Suicidal ideations 01/10/2019  . Marijuana smoker, continuous 01/10/2019  . Sinus bradycardia 01/10/2019  . Family history of colon cancer 01/10/2019  . Cigarette nicotine dependence without complication 01/10/2019  . Bradycardia 08/27/2016  . Ureteral calculus, left 07/14/2013  . Microhematuria 06/19/2013  . Encounter for preventative adult health care exam with abnormal findings 07/10/2011  . Allergic conjunctivitis 01/29/2009  . Allergic rhinitis 01/29/2009   OCCUPATIONAL THERAPY DISCHARGE SUMMARY  Visits from Start of Care: 10  Current functional level related to goals / functional outcomes: Pt is stepping down to IOP level of care for continued maintenance of MH.   Remaining deficits: Continuing to implement skills learned   Education /  Equipment: Education given on psychosocial skills and coping mechanisms as it applies to BADL/IADL function Plan: Patient agrees to discharge.  Patient goals were met. Patient is being discharged due to meeting the stated rehab goals.  ?????     Blake Walsh, MSOT, OTR/L Behavioral Health OT/ Acute Relief OT PHP Office: [phone]  Blake Walsh 01/29/2019, 3:24 PM  [facility] PARTIAL HOSPITALIZATION PROGRAM [address] , [place], [address] Phone: [phone]   Fax:  [phone]  Name: Blake Walsh MRN: [id number] Date of Birth: 09/14/1991 

## 2019-01-29 NOTE — Psych (Signed)
Virtual Visit via Video Note  I connected with Blake Walsh on 01/25/19 at  9:00 AM EDT by a video enabled telemedicine application and verified that I am speaking with the correct person using two identifiers.   I discussed the limitations of evaluation and management by telemedicine and the availability of in person appointments. The patient expressed understanding and agreed to proceed.    I discussed the assessment and treatment plan with the patient. The patient was provided an opportunity to ask questions and all were answered. The patient agreed with the plan and demonstrated an understanding of the instructions.   The patient was advised to call back or seek an in-person evaluation if the symptoms worsen or if the condition fails to improve as anticipated.  Pt was provided 240 minutes of non-face-to-face time during this encounter.   Donia Guiles, LCSW    [facility] BH PHP THERAPIST PROGRESS NOTE  Tibor Liburd [phone]  Session Time: 9:00 - 10:00  Participation Level: Active  Behavioral Response: CasualAlertDepressed  Type of Therapy: Group Therapy  Treatment Goals addressed: Coping  Interventions: CBT, DBT, Supportive and Reframing  Summary: Clinician led check-in regarding current stressors and situation, and review of patient completed daily inventory. Clinician utilized active listening and empathetic response and validated patient emotions. Clinician facilitated processing group on pertinent issues  Therapist Response: Treycen Geary is a 28 y.o. male who presents with depression symptoms.  Patient arrived within time allowed and reports that he is feeling "pretty good."Patient rates his mood at a4.5 on a scale of 1-10 with 10 being great and states it is low due to feeling physically unsettled. Pt reports he went on an "unhealthy binge" yesterday and his stomach is upset. Pt reports his daughter fell twice yesterday and he is struggling with guilt and blame. Pt states knowing  toddlers trip and explore, however is "really beating myself up." Pt states when stressed he feels he becomes self-indulgent. Pt struggles to move past black and white thinking. Pt able to process. Patient engaged in discussion.      Session Time: 10:00 -11:00  Participation Level: Active  Behavioral Response: CasualAlertDepressed  Type of Therapy: Group Therapy, psychoeducation, psychotherapy  Treatment Goals addressed: Coping  Interventions: CBT, DBT, Solution Focused, Supportive and Reframing  Summary:  Cln led discussion on making well informed decisions. Cln discussed the importance of looking at pros and cons of each situation and making a decision with acceptance of both. Group shared ways in which this is a struggle for them and/or has presented in their life.  Therapist Response:  Pt states he is struggling with using food and marijuana as a coping skill when stressed. Pt shares he was eating healthy and working out regularly less than 6 months ago, however when the changes form work and stay at home orders happened he has struggled to maintain that lifestyle. Pt states knowing he feels better on healthier food but wants fast food or junk food and eats that instead. Similarly pt reports desire to try psychotropic medication, but struggles with not smoking marijuana so he can gave the medication a chance. Pt states he does not feel his decision making is informed and acknowledges that he is relying on instant gratification in both of these cases. Pt accepts suggestions of moderation, exploring "gray" areas, and making an eyes wide open decision.       Session Time: 11:00 - 12:00  Participation Level:Active  Behavioral Response:CasualAlertDepressed  Type of Therapy: Group Therapy, OT  Treatment Goals  addressed: Coping  Interventions:Psychosocial skills training, Supportive,   Summary:Occupational Therapy group  Therapist Response: Patient engaged  in group. See OT note.       Session Time: 12:00- 1:00  Participation Level: Active  Behavioral Response: CasualAlertDepressed  Type of Therapy: Group Therapy, Psychoeducation; Psychotherapy  Treatment Goals addressed: Coping  Interventions: CBT; Solution focused; Supportive; Reframing  Summary: 12:00 - 12:50 Clinician continued discussion on boundaries. Clinician reviewed information discussed yesterday. Group discussed characteristics of rigid, porous, and healthy boundaries. Cln provided education on the different ways boundaries can present including material, time, physical, and emotional and examples of each presentation.  12:50 -1:00 Clinician led check-out. Clinician assessed for immediate needs, medication compliance and efficacy, and safety concerns   Therapist Response: Patient states understanding of types of boundaries and reports having mainly porous boundaries.  At check-out, patient rates his mood at a 5 on a scale of 1-10 with 10 being great. Patient reports weekend plans of spending time with his daughter and doing chores around the house. Patient demonstrates some progress as evidenced by willingness to re-evaluate decisions. Pt denies SI/HI at end of group.     Suicidal/Homicidal: Nowithout intent/plan  Plan: Pt will continue in PHP while working to decrease depression and SI thoughts, and increase ability to manage symptoms in a healthy manner when they arise.     Diagnosis: MDD (major depressive disorder), recurrent severe, without psychosis (HCC) [F33.2]    1. MDD (major depressive disorder), recurrent severe, without psychosis (HCC)       Donia GuilesJenny Baylor Cortez, LCSW 01/29/2019

## 2019-01-30 ENCOUNTER — Encounter (HOSPITAL_COMMUNITY): Payer: Self-pay | Admitting: Family

## 2019-01-30 ENCOUNTER — Telehealth (HOSPITAL_COMMUNITY): Payer: Self-pay | Admitting: Psychiatry

## 2019-01-30 ENCOUNTER — Other Ambulatory Visit: Payer: Self-pay

## 2019-01-30 ENCOUNTER — Other Ambulatory Visit (HOSPITAL_COMMUNITY): Payer: 59 | Admitting: Licensed Clinical Social Worker

## 2019-01-30 ENCOUNTER — Ambulatory Visit (HOSPITAL_COMMUNITY): Payer: Self-pay

## 2019-01-30 VITALS — Ht 68.0 in | Wt 197.0 lb

## 2019-01-30 DIAGNOSIS — F332 Major depressive disorder, recurrent severe without psychotic features: Secondary | ICD-10-CM | POA: Diagnosis not present

## 2019-01-30 NOTE — Progress Notes (Addendum)
GROUP NOTE - spiritual care group 01/30/2019 11:00 - 12:00 ?Facilitated by Wilkie Aye, MDiv, BCC. Group was held via [employer] in compliance with COVID-19 precautions ? ? Group focused on topic of strength. ?Group members reflected on what thoughts and feelings emerge when they hear this topic. ?They then engaged in facilitated dialog around how strength is present in their lives. This dialog focused on representing what strength had been to them in their lives (images and patterns given) and what they saw as helpful in their life now (what they needed / wanted). ? ? Activity drew on narrative framework   Blake Walsh was present throughout group.  Engaged in group activity and facilitated discussion.  Noted Strength as "bravery" and described this with metaphor of battle - stated bravery does not mean not having fear or being able to overcome everything - but rather being connected to values and vocation and facing what challenges arise.  He noted this in connection with depression and relationships in his life.     Burnis Kingfisher, MDiv, [facility]

## 2019-01-30 NOTE — Progress Notes (Signed)
   Virtual Visit via Telephone Note  I connected with [name] on 01/30/19 at  9:00 AM EDT by telephone and verified that I am speaking with the correct person using two identifiers.   I discussed the limitations, risks, security and privacy concerns of performing an evaluation and management service by telephone and the availability of in person appointments. I also discussed with the patient that there may be a patient responsible charge related to this service. The patient expressed understanding and agreed to proceed.  I discussed the assessment and treatment plan with the patient. The patient was provided an opportunity to ask questions and all were answered. The patient agreed with the plan and demonstrated an understanding of the instructions.   The patient was advised to call back or seek an in-person evaluation if the symptoms worsen or if the condition fails to improve as anticipated.  I provided 15 minutes of non-face-to-face time during this encounter.   Blake Rack, NP   [facility] Partial Hospitalization Outpatient Program Discharge Summary  Blake Walsh [id number]  Admission date: 01/14/2019 Discharge date: 01/30/2019  Reason for admission: Depression and Anxiety   Per admission assessment note: Blake Walsh is a 28 y.o. caucasian male presents with depression and anxeity.  Patient reports impulsive and irritably mood when he gets angry or upset.  States after he has a "angry episode" he then  starts to feel shameful and guilt and then will take it out on his self. Reported banging head against the wall. " I feel like I could just run through a brick wall."  Stated he had seen a therapist in the past however is followed by his primary care provider [name].  Patient reports daily use of marijuana. Stated PCP wrote for medication however has never picked up medication.  Chemical Use History:  Reported daily marijuana use on admissions, however is  currently reporting decreased use since starting medications.   Family of Origin Issues: Continue to report a martial strain and communication issues. Patient is unable to identify any particular stressors. Patient reported that marital counseling is going well, however he would like to individual therapist.    Progress in Program Toward Treatment Goals: Ongoing, patient has attended and participated with daily group sessions.   Progress (rationale): Stepping down to Insensitive  Outpatient Programing. (IOP)  Patient was initiated on Zoloft 50 mg, Vistaril 25 mg and Trazodone 50 mg. Patient reported concerns with tiredness with vistaril 25 mg however has declined decreased dose. Will continue to f/u with while attending IOP.    Take all medications as prescribed. Keep all follow-up appointments as scheduled.  Do not consume alcohol or use illegal drugs while on prescription medications. Report any adverse effects from your medications to your primary care provider promptly.  In the event of recurrent symptoms or worsening symptoms, call 911, a crisis hotline, or go to the nearest emergency department for evaluation.    Blake Rack, NP 01/30/2019

## 2019-01-30 NOTE — Progress Notes (Signed)
Met with patient through virtual WebEx to assess his status with plans to finish PHP today and to transition to the Intensive Outpatient Program.  Patient denied any suicidal or homicidal ideations, no auditory or visual hallucinations and no plan or intent to want to harm self or others at this time.  Patient stated he has been taking medications for 4 days now and plans to continue taking it daily as prescribed.  Reports he is only taking Hydroxyzine 25 mg, one time a day as it does make him a little drowsy after taking.  Patient reports he does feel this helps some with anxiety and admitted he is still "taking a few tokes a day" of marijuana.  Discussed medications with patient and encouraged him to discuss the hydroxzyine with Ricky Ala, NP if he wanted as informed they do make smaller dosages that may be less sedating but could still help with anxiety or to change to tablet form for taking 1/2 or less if NP approved.  Patient reported no other side effects or problems with medications and rated his current level of depression a 2-3 and anxiety a 6 on a scale of 0-10 with 0 none and 10 the worst he could manage.  Patient stated he does feel more hopeful since starting PHP and his PHQ9 depression screening dropped from a 22 on 01/10/19 to a 9 today.  Patient reported he has gained some weight due to increased appetite, up 5 pounds since 01/10/19 and agreed he would continue to monitor and to keep NP informed.  Patient discussed transition to IOP and stated he would try the program but is not so sure another group setting is what he needs now since he has learned a lot of coping and other skills.  Patient reported plan to try the program and will keep their staff and NP informed if then decides he needs more individual therapy.  Patient with brighter affect and more level mood noted today and will call this nurse or NP if any problems with transition to IOP or after completing PHP today.  Patient stable with no  other concerns noted and agreed to inform NP of drowsiness reported with hydroxzyine.  Information shared with Ricky Ala, NP who plans to meet with patient today for virtual session.

## 2019-01-31 ENCOUNTER — Other Ambulatory Visit (HOSPITAL_COMMUNITY): Payer: Self-pay

## 2019-01-31 ENCOUNTER — Telehealth (HOSPITAL_COMMUNITY): Payer: Self-pay | Admitting: Psychiatry

## 2019-01-31 ENCOUNTER — Ambulatory Visit (HOSPITAL_COMMUNITY): Payer: Self-pay

## 2019-01-31 NOTE — Telephone Encounter (Signed)
D:  Pt was transitioned to [facility] from [facility] today.  Upon orienting pt this morning, pt voiced financial concerns.  "I have a very high deductible, so I need to call my insurance company before I decide to start this program." Pt phoned writer back and left vm that he would like individual therapy instead of group now.  A:  Placed call to pt, but there was no answer.  Left the [facility]'s phone number for pt to call and get scheduled with a therapist and/ psychiatrist.  Inform [name], NP.

## 2019-02-01 ENCOUNTER — Other Ambulatory Visit (HOSPITAL_COMMUNITY): Payer: Self-pay

## 2019-02-01 ENCOUNTER — Ambulatory Visit (HOSPITAL_COMMUNITY): Payer: Self-pay

## 2019-02-04 ENCOUNTER — Other Ambulatory Visit (HOSPITAL_COMMUNITY): Payer: Self-pay | Admitting: Family

## 2019-02-11 NOTE — Psych (Signed)
Virtual Visit via Video Note  I connected with Blake Walsh on 01/28/19 at  9:00 AM EDT by a video enabled telemedicine application and verified that I am speaking with the correct person using two identifiers.   I discussed the limitations of evaluation and management by telemedicine and the availability of in person appointments. The patient expressed understanding and agreed to proceed.    I discussed the assessment and treatment plan with the patient. The patient was provided an opportunity to ask questions and all were answered. The patient agreed with the plan and demonstrated an understanding of the instructions.   The patient was advised to call back or seek an in-person evaluation if the symptoms worsen or if the condition fails to improve as anticipated.  Pt was provided 240 minutes of non-face-to-face time during this encounter.   Donia GuilesJenny Constantina Laseter, LCSW    [facility] PHP THERAPIST PROGRESS NOTE  Blake Walsh [id number]  Session Time: 9:00 - 10:00  Participation Level: Active  Behavioral Response: CasualAlertDepressed  Type of Therapy: Group Therapy  Treatment Goals addressed: Coping  Interventions: CBT, DBT, Supportive and Reframing  Summary: Clinician led check-in regarding current stressors and situation, and review of patient completed daily inventory. Clinician utilized active listening and empathetic response and validated patient emotions. Clinician facilitated processing group on pertinent issues  Therapist Response: Blake Walsh is a 28 y.o. male who presents with depression symptoms.  Patient arrived within time allowed and reports that he is feeling "okay."Patient rates his mood at a6 on a scale of 1-10 with 10 being great. Pt reports he had a "really, really good" weekend and that he reconnected with a friend who is also a father, played golf, and spent time with his family. Pt shares he was in a better mood prior to 20 minutes ago when he got into an argument with his  wife. Pt states the argument is similar to others they have had that fall along their different communication styles. Pt states struggles to feel present in group because he is continuing to ruminate about the argument. Pt declines to engage with skills to bring him present.Patient minimally engaged in discussion.      Session Time: 10:00 -11:00  Participation Level: Active  Behavioral Response: CasualAlertDepressed  Type of Therapy: Group Therapy, psychoeducation, psychotherapy  Treatment Goals addressed: Coping  Interventions: CBT, DBT, Solution Focused, Supportive and Reframing  Summary:  Cln introduced concept of balance and how to balance two "opposites" by allowing contradicting ideas to live together, such as I love you and you annoy me right now. Cln included discussion of perception and the reminder that the problem is not always the problem, but instead it is how we are viewing the problem that is causing issues. Group discussed how this may be creating obstacles in their lives.  Therapist Response:  Pt shares feeling overwhelmed with being a father and husband at times and that he feels guilty about that. Pt states understanding that feeling overwhelmed does not negate his love or ability to be a good partner/father and states "hopefully I can believe that one day."       Session Time: 11:00 - 12:00  Participation Level:Active  Behavioral Response:CasualAlertDepressed  Type of Therapy: Group Therapy, OT  Treatment Goals addressed: Coping  Interventions:Psychosocial skills training, Supportive,   Summary:Occupational Therapy group  Therapist Response: Patient engaged in group. See OT note.       Session Time: 12:00- 1:00  Participation Level: Active  Behavioral Response: CasualAlertDepressed  Type of Therapy: Group Therapy, Psychoeducation; Psychotherapy  Treatment Goals addressed: Coping  Interventions: CBT; Solution  focused; Supportive; Reframing  Summary: 12:00 - 12:50 Cln continued topic of boundaries, introducing how to set boundaries. Cln reviewed the importance of recognizing values, communicating needs, and using assertiveness when setting a boundary. Group practiced setting boundaries with basic examples.  12:50 -1:00 Clinician led check-out. Clinician assessed for immediate needs, medication compliance and efficacy, and safety concerns   Therapist Response: Patient reports understanding of how to set a boundary and is minimally engaged in group, stating he is still not "all here."  At check-out, patient rates his mood at a 6 on a scale of 1-10 with 10 being great. Patient reports afternoon plans of "eating and then evaluating from there." Pt is able to list counting backwards, playing a video game, and focusing on chores as ways he can manage mood should he become escalated due to the ruminating.  Patient demonstrates limited progress as evidenced by being unwilling to use skills to manage rumination in group. Pt denies SI/HI at end of group.     Suicidal/Homicidal: Nowithout intent/plan  Plan: Pt will continue in PHP while working to decrease depression and SI thoughts, and increase ability to manage symptoms in a healthy manner when they arise.     Diagnosis: MDD (major depressive disorder), recurrent severe, without psychosis (HCC) [F33.2]    1. MDD (major depressive disorder), recurrent severe, without psychosis (HCC)       Donia Guiles, LCSW 02/11/2019

## 2019-02-12 NOTE — Psych (Signed)
Virtual Visit via Video Note  I connected with Blake Walsh on 01/29/19 at  9:00 AM EDT by a video enabled telemedicine application and verified that I am speaking with the correct person using two identifiers.   I discussed the limitations of evaluation and management by telemedicine and the availability of in person appointments. The patient expressed understanding and agreed to proceed.    I discussed the assessment and treatment plan with the patient. The patient was provided an opportunity to ask questions and all were answered. The patient agreed with the plan and demonstrated an understanding of the instructions.   The patient was advised to call back or seek an in-person evaluation if the symptoms worsen or if the condition fails to improve as anticipated.  Pt was provided 240 minutes of non-face-to-face time during this encounter.   Blake Guiles, LCSW    [facility] BH PHP THERAPIST PROGRESS NOTE  Blake Walsh [id number]  Session Time: 9:00 - 10:00  Participation Level: Active  Behavioral Response: CasualAlertDepressed  Type of Therapy: Group Therapy  Treatment Goals addressed: Coping  Interventions: CBT, DBT, Supportive and Reframing  Summary: Clinician led check-in regarding current stressors and situation, and review of patient completed daily inventory. Clinician utilized active listening and empathetic response and validated patient emotions. Clinician facilitated processing group on pertinent issues  Therapist Response: Blake Walsh is a 28 y.o. male who presents with depression symptoms.  Patient arrived within time allowed and reports that he is feeling "pretty good."Patient rates his mood at [facility] on a scale of 1-10 with 10 being great. Pt reports he slept "really well" which is helping his mood. Pt shares that he achieved some closure on the argument with his wife yesterday, however things are still unsettled. Pt reports he did chores and played a video game yesterday. Pt is  in his car today for session, stating he wanted to be able to focus just on group without the house background issues. Pt states he is trying to be more assertive about his needs and this was a step forward. Patient engaged in discussion.      Session Time: 10:00 -11:00  Participation Level: Active  Behavioral Response: CasualAlertDepressed  Type of Therapy: Group Therapy, psychoeducation, psychotherapy  Treatment Goals addressed: Coping  Interventions: CBT, DBT, Solution Focused, Supportive and Reframing  Summary:  Cln led discussion on trust in relationships. Group shared ways in which trust is a struggle for them and how it affects their relationships.   Therapist Response:  Pt shares his biggest trust issues are with himself and that he does not trust himself to "be enough" or "do the right thing" in his relationships. Pt reports fears of role failure. Pt is able to reframe with help from group.       Session Time: 11:00 - 12:00  Participation Level:Active  Behavioral Response:CasualAlertDepressed  Type of Therapy: Group Therapy, OT  Treatment Goals addressed: Coping  Interventions:Psychosocial skills training, Supportive,   Summary:Occupational Therapy group  Therapist Response: Patient engaged in group. See OT note.       Session Time: 12:00- 1:00  Participation Level: Active  Behavioral Response: CasualAlertDepressed  Type of Therapy: Group Therapy, Psychoeducation; Psychotherapy  Treatment Goals addressed: Coping  Interventions: CBT; Solution focused; Supportive; Reframing  Summary: 12:00 - 12:50 Cln led boundary workshop in which group members shared boundary issues they are dealing with. Cln coached pts on good boundary tenets and group members provided feedback and support to each other while working through the personal  examples.  12:50 -1:00 Clinician led check-out. Clinician assessed for immediate needs,  medication compliance and efficacy, and safety concerns   Therapist Response: Patient is active in his feedback and support of group members and does not identify a personal boundary issue.  At check-out, patient rates his mood at a 7 on a scale of 1-10 with 10 being great. Patient reports afternoon plans of couples counseling, chores, and playing video games.  Patient demonstrates some progress as evidenced by improved sleep. Pt denies SI/HI at end of group.     Suicidal/Homicidal: Nowithout intent/plan  Plan: Pt will continue in PHP while working to decrease depression and SI thoughts, and increase ability to manage symptoms in a healthy manner when they arise.     Diagnosis: MDD (major depressive disorder), recurrent severe, without psychosis (HCC) [F33.2]    1. MDD (major depressive disorder), recurrent severe, without psychosis (HCC)       Blake GuilesJenny Zyon Grout, LCSW 02/12/2019

## 2019-02-18 NOTE — Psych (Signed)
Virtual Visit via Video Note  I connected with Blake Walsh on 01/30/19 at  9:00 AM EDT by a video enabled telemedicine application and verified that I am speaking with the correct person using two identifiers.   I discussed the limitations of evaluation and management by telemedicine and the availability of in person appointments. The patient expressed understanding and agreed to proceed.    I discussed the assessment and treatment plan with the patient. The patient was provided an opportunity to ask questions and all were answered. The patient agreed with the plan and demonstrated an understanding of the instructions.   The patient was advised to call back or seek an in-person evaluation if the symptoms worsen or if the condition fails to improve as anticipated.  Pt was provided 240 minutes of non-face-to-face time during this encounter.   Blake GuilesJenny Kalyani Maeda, LCSW    [facility] THERAP[facility] PROGRESS [facility]  Blake Walsh [id number]  Session Time: 9:00 - 10:00  Participation Level: Active  Behavioral Response: CasualAlertDepressed  Type of Therapy: Group Therapy  Treatment Goals addressed: Coping  Interventions: CBT, DBT, Supportive and Reframing  Summary: Clinician led check-in regarding current stressors and situation, and review of patient completed daily inventory. Clinician utilized active listening and empathetic response and validated patient emotions. Clinician facilitated processing group on pertinent issues  Therapist Response: Blake MiyamotoKyle Walsh is a 28 y.o. male who presents with depression symptoms.  Patient arrived within time allowed and reports that he is feeling "high anxiety."[facility]-10 with 10 being great. Pt shares he is watching his daughter alone while in group this morning and that is making him nervous. Pt states yesterday was "fine" and that couple's counseling created a tentative peace between he and his wife. Pt shares that he has been  sleeping on the couch because his sleep patterns disturb his daughter and wife which make him self conscious. Pt states he wishes it didn't have to be this way but it has helped him sleep better. Patient engaged in discussion.      Session Time: 10:00 -11:00  Participation Level: Active  Behavioral Response: CasualAlertDepressed  Type of Therapy: Group Therapy, psychoeducation, psychotherapy  Treatment Goals addressed: Coping  Interventions: CBT, DBT, Solution Focused, Supportive and Reframing  Summary:  Cln led discussion on absolute thinking and the ways in which it can upset our perception.  Therapist Response: Patient states he understands that absolute thinking is not helpful, however he finds comfort in it's certainty. Pt is able to hold both of those feelings, demonstrating progress.       Session Time: 11:00 -12:00  Participation Level:Active  Behavioral Response:CasualAlertDepressed  Type of Therapy: Group Therapy, psychotherapy  Treatment Goals addressed: Coping  Interventions:Strengths based, reframing, Supportive,   Summary:Spiritual Care group  Therapist Response: Patient engaged in group. See chaplain note.       Session Time: 12:00- 1:00  Participation Level:Active  Behavioral Response:CasualAlertDepressed  Type of Therapy: Group Therapy, Psychoeducation  Treatment Goals addressed: Coping  Interventions:relaxation training; Supportive; Reframing  Summary:12:00 - 12:50: Relaxation group: Cln led group focused on retraining the body's response to stress.  12:50 -1:00 Clinician led check-out. Clinician assessed for immediate needs, medication compliance and efficacy, and safety concerns  Therapist Response:Pt engaged in activity and discussion. At check-out, patient rates [facility]ood [facility] scale [facility]-10 with 10 being great. Patient reports afternoon plansofeating lunch, spending time with his  daughter, and chores. Patient demonstrates some progress as evidenced  byoverall improved ability to manage stress. Patient denies SI/HI/self-harm thoughts at the end of group.   Suicidal/Homicidal: Nowithout intent/plan  Plan: Pt will discharge from PHP due to meeting treatment goals of decreased depression and SI thoughts, and increased ability to manage symptoms in a healthy manner when they arise. Pt will discharge to IOP within this agency, beginning tomorrow 01/31/19. Pt and provider are aligned with discharge. Pt denies SI/HI/psychosis at time of discharge.     Diagnosis: MDD (major depressive disorder), recurrent severe, without psychosis (HCC) [F33.2]    1. MDD (major depressive disorder), recurrent severe, without psychosis (HCC)       Blake Guiles, LCSW 02/18/2019

## 2019-03-09 ENCOUNTER — Other Ambulatory Visit (HOSPITAL_COMMUNITY): Payer: Self-pay | Admitting: Family

## 2019-06-10 ENCOUNTER — Encounter: Payer: Self-pay | Admitting: Podiatry

## 2019-06-10 ENCOUNTER — Ambulatory Visit (INDEPENDENT_AMBULATORY_CARE_PROVIDER_SITE_OTHER): Payer: Managed Care, Other (non HMO) | Admitting: Podiatry

## 2019-06-10 ENCOUNTER — Other Ambulatory Visit: Payer: Self-pay

## 2019-06-10 ENCOUNTER — Ambulatory Visit (INDEPENDENT_AMBULATORY_CARE_PROVIDER_SITE_OTHER): Payer: Managed Care, Other (non HMO)

## 2019-06-10 VITALS — BP 110/58 | HR 52 | Resp 16

## 2019-06-10 DIAGNOSIS — M7989 Other specified soft tissue disorders: Secondary | ICD-10-CM | POA: Diagnosis not present

## 2019-06-10 DIAGNOSIS — M79674 Pain in right toe(s): Secondary | ICD-10-CM

## 2019-06-10 DIAGNOSIS — M2041 Other hammer toe(s) (acquired), right foot: Secondary | ICD-10-CM

## 2019-06-10 MED ORDER — MELOXICAM 15 MG PO TABS: 15.0000 mg | ORAL_TABLET | Freq: Every day | ORAL | 0 refills | Status: AC

## 2019-06-10 MED ORDER — DICLOFENAC SODIUM 1 % TD GEL: 2.0000 g | Freq: Four times a day (QID) | TRANSDERMAL | 0 refills | Status: DC

## 2019-06-10 NOTE — Progress Notes (Signed)
Subjective:   Patient ID: Blake Walsh, male   DOB: 28 y.o.   MRN: [id number]   HPI 28 year old male presents the office today for concerns of pain to the bottom of his right third toe has noticed a lump the last 3 to 4 months.  States the area has progressed and become painful.  He states that it feels like he is walking on a rock.  No recent treatment or injury.   Review of Systems  All other systems reviewed and are negative.  Past Medical History:  Diagnosis Date  . Allergic rhinitis   . Anxiety   . Bradycardia 08/27/2016  . Depression   . Kidney stone   . Ureteral calculus, left 07/14/2013    Past Surgical History:  Procedure Laterality Date  . NO PAST SURGERIES       Current Outpatient Medications:  .  diclofenac sodium (VOLTAREN) 1 % GEL, Apply 2 g topically 4 (four) times daily. Rub into affected area of foot 2 to 4 times daily, Disp: 100 g, Rfl: 0 .  meloxicam (MOBIC) 15 MG tablet, Take 1 tablet (15 mg total) by mouth daily., Disp: 30 tablet, Rfl: 0  No Known Allergies       Objective:  Physical Exam  General: AAO x3, NAD  Dermatological: Skin is warm, dry and supple bilateral. Nails x 10 are well manicured; remaining integument appears unremarkable at this time. There are no open sores, no preulcerative lesions, no rash or signs of infection present.  Vascular: Dorsalis Pedis artery and Posterior Tibial artery pedal pulses are 2/4 bilateral with immedate capillary fill time. Pedal hair growth present. No varicosities and no lower extremity edema present bilateral. There is no pain with calf compression, swelling, warmth, erythema.   Neruologic: Grossly intact via light touch bilateral. Vibratory intact via tuning fork bilateral. Protective threshold with Semmes Wienstein monofilament intact to all pedal sites bilateral.  Musculoskeletal: Firm not mobile soft tissue mass present plantar aspect of right third toe on the PIPJ.  Tenderness palpation of this area.  No  significant swelling of the toe.  Muscular strength 5/5 in all groups tested bilateral.  Gait: Unassisted, Nonantalgic.       Assessment:   Likely soft tissue lesion plantar right third toe    Plan:  -Treatment options discussed including all alternatives, risks, and complications -Etiology of symptoms were discussed -X-rays were obtained and reviewed with the patient. No evidence of acute fracture or foreign body.  No calcifications.  -Dispensed offloading pads.  Prescribed meloxicam as well as topical Voltaren gel.  I ordered an ultrasound to evaluate the lesion.  Discussed surgical excision.  Await results of the ultrasound.  Trula Slade DPM

## 2019-06-11 ENCOUNTER — Telehealth: Payer: Self-pay | Admitting: *Deleted

## 2019-06-11 DIAGNOSIS — M7989 Other specified soft tissue disorders: Secondary | ICD-10-CM

## 2019-06-11 DIAGNOSIS — M79674 Pain in right toe(s): Secondary | ICD-10-CM

## 2019-06-11 NOTE — Telephone Encounter (Signed)
Orders faxed to [facility]. 

## 2019-06-11 NOTE — Telephone Encounter (Signed)
-----   Message from Trula Slade, DPM sent at 06/10/2019  4:49 PM EDT ----- Can you please order a diagnostic ultrasound to evaluate a soft tissue mass on the plantar right 3rd toe? Thanks.

## 2019-06-18 ENCOUNTER — Ambulatory Visit
Admission: RE | Admit: 2019-06-18 | Discharge: 2019-06-18 | Disposition: A | Discharge status: Home / Self Care | Payer: Managed Care, Other (non HMO) | Source: Ambulatory Visit | Attending: Podiatry | Admitting: Podiatry

## 2019-06-18 DIAGNOSIS — M7989 Other specified soft tissue disorders: Secondary | ICD-10-CM

## 2019-06-18 DIAGNOSIS — M79674 Pain in right toe(s): Secondary | ICD-10-CM

## 2019-06-20 ENCOUNTER — Telehealth: Payer: Self-pay | Admitting: *Deleted

## 2019-06-20 NOTE — Telephone Encounter (Signed)
Left message informing pt of Dr. Leigh Aurora review of results and to call to schedule to discuss treatment options.

## 2019-06-20 NOTE — Telephone Encounter (Signed)
-----   Message from Trula Slade, DPM sent at 06/20/2019  7:11 AM EDT ----- Val- please let him know the ultrasound did show a cyst. Based on the ultrasound we can try to aspirate it in the office. Can you let him know and have him come in for an appointment to further discuss treatment options? Thanks.

## 2019-07-18 ENCOUNTER — Ambulatory Visit: Payer: Managed Care, Other (non HMO) | Admitting: Podiatry

## 2022-11-25 ENCOUNTER — Ambulatory Visit
Admission: EM | Admit: 2022-11-25 | Discharge: 2022-11-25 | Disposition: A | Discharge status: Home / Self Care | Payer: PRIVATE HEALTH INSURANCE | Attending: Nurse Practitioner | Admitting: Nurse Practitioner

## 2022-11-25 DIAGNOSIS — G44209 Tension-type headache, unspecified, not intractable: Secondary | ICD-10-CM | POA: Diagnosis not present

## 2022-11-25 DIAGNOSIS — J029 Acute pharyngitis, unspecified: Secondary | ICD-10-CM | POA: Diagnosis not present

## 2022-11-25 DIAGNOSIS — Z1152 Encounter for screening for COVID-19: Secondary | ICD-10-CM | POA: Insufficient documentation

## 2022-11-25 LAB — POCT RAPID STREP A (OFFICE): Rapid Strep A Screen: NEGATIVE

## 2022-11-25 NOTE — Discharge Instructions (Addendum)
Your Strep Test is negative. Your lab results and COVID test are pending. Your results will show in your MyChart. Any positive results will result in a phone call from a nurse with next steps in treatment and recommendations.    You are experiencing tension headaches.  We encourage conservative treatment with symptom relief. You can try OTC Excedrin Tension Headache relief as directed.  You can also continue to use Tylenol or Ibuprofen for your pain (Remember to use as directed do not exceed daily dosing recommendations). We also encourage salt water gargles for your sore throat. You should also consider throat lozenges and chloraseptic spray. Lemon and honey tea can also soothe your throat.

## 2022-11-25 NOTE — ED Triage Notes (Signed)
Patient with c/o neck pain and a headache since yesterday. Patient states he is having a hard time concentrating and wants to "make sure he doesn't have meningitis".

## 2022-11-25 NOTE — ED Provider Notes (Signed)
[facility]    CSN: [id number] Arrival date & time: 11/25/22  0957      History   Chief Complaint Chief Complaint  Patient presents with   Neck Pain    HPI Blake Walsh is a 32 y.o. male.   HPI  He is in today for headache and neck pain since Wednesday. He reports that he slept wrong and thought this was related. He felt lethargic the next day. He later began to search the internet and is concern that he may have meningitis. He works at a [employer]. 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 He denies fever. He has been able to function as normal. He denies any dizziness, problems with thoughts, word retrieval, swallowing. He is eating and drinking but does feel some pain on both sides of the neck. He has taken APAP PM which allowed him to rest on last night.  Past Medical History:  Diagnosis Date   Allergic rhinitis    Anxiety    Bradycardia 08/27/2016   Depression    Kidney stone    Ureteral calculus, left 07/14/2013    Patient Active Problem List   Diagnosis Date Noted   Current severe episode of major depressive disorder without psychotic features without prior episode ([place]) 01/10/2019   Suicidal ideations 01/10/2019   Marijuana smoker, continuous 01/10/2019   Sinus bradycardia 01/10/2019   Family history of colon cancer 01/10/2019   Cigarette nicotine dependence without complication [id number]   Bradycardia 08/27/2016   Ureteral calculus, left 07/14/2013   Microhematuria 06/19/2013   Encounter for preventative adult health care exam with abnormal findings 07/10/2011   Allergic conjunctivitis 01/29/2009   Allergic rhinitis 01/29/2009    Past Surgical History:  Procedure Laterality Date   NO PAST SURGERIES         Home Medications    Prior to Admission medications   Not on File    Family History Family History  Problem Relation Age of Onset   Hyperlipidemia Father    Hypertension Maternal Grandfather    Colon cancer Paternal  Grandfather    Hypertension Paternal Grandfather    Alcohol abuse Paternal Uncle    Alcohol abuse Paternal Uncle     Social History Social History   Tobacco Use   Smoking status: Every Day    Packs/day: 0.50    Years: 9.00    Total pack years: 4.50    Types: Cigarettes    Start date: 11/13/2012   Smokeless tobacco: Never  Substance Use Topics   Alcohol use: Not Currently   Drug use: Yes    Types: Marijuana    Comment: 1g/day 01/10/19, Now takes "2 hits a day' - 01/30/19     Allergies   Patient has no known allergies.   Review of Systems Review of Systems   Physical Exam Triage Vital Signs ED Triage Vitals [11/25/22 1036]  Enc Vitals Group     BP 107/62     Pulse Rate 75     Resp 18     Temp 98.4 F (36.9 C)     Temp Source Oral     SpO2 96 %     Weight      Height      Head Circumference      Peak Flow      Pain Score 6     Pain Loc      Pain Edu?      Excl. in [place]?  No data found.  Updated Vital Signs BP 107/62 (BP Location: Left Arm)   Pulse 75   Temp 98.4 F (36.9 C) (Oral)   Resp 18   SpO2 96%   Visual Acuity Right Eye Distance:   Left Eye Distance:   Bilateral Distance:    Right Eye Near:   Left Eye Near:    Bilateral Near:     Physical Exam Constitutional:      General: He is not in acute distress.    Appearance: He is not ill-appearing, toxic-appearing or diaphoretic.  HENT:     Head: Normocephalic and atraumatic.     Right Ear: Tympanic membrane normal.     Left Ear: Tympanic membrane normal.     Nose: Nose normal.     Mouth/Throat:     Mouth: Mucous membranes are dry.  Eyes:     Pupils: Pupils are equal, round, and reactive to light.  Neck:     Vascular: No carotid bruit.  Cardiovascular:     Rate and Rhythm: Normal rate and regular rhythm.     Pulses: Normal pulses.     Heart sounds: Normal heart sounds.  Pulmonary:     Effort: Pulmonary effort is normal.     Breath sounds: Normal breath sounds.  Musculoskeletal:         General: Normal range of motion.     Cervical back: Normal range of motion. Tenderness present. No rigidity.  Lymphadenopathy:     Cervical: No cervical adenopathy.  Skin:    General: Skin is warm and dry.     Capillary Refill: Capillary refill takes less than 2 seconds.  Neurological:     General: No focal deficit present.     Mental Status: He is alert and oriented to person, place, and time.     Cranial Nerves: No cranial nerve deficit.     Motor: No weakness.  Psychiatric:        Mood and Affect: Mood normal.        Behavior: Behavior normal.      UC Treatments / Results  Labs (all labs ordered are listed, but only abnormal results are displayed) Labs Reviewed  SARS CORONAVIRUS 2 (TAT 6-24 HRS)  POCT RAPID STREP A (OFFICE)    EKG   Radiology No results found.  Procedures Procedures (including critical care time)  Medications Ordered in UC Medications - No data to display  Initial Impression / Assessment and Plan / UC Course  I have reviewed the triage vital signs and the nursing notes.  Pertinent labs & imaging results that were available during my care of the patient were reviewed by me and considered in my medical decision making (see chart for details).     headache Final Clinical Impressions(s) / UC Diagnoses   Final diagnoses:  Sore throat  Acute non intractable tension-type headache     Discharge Instructions      Your Strep Test is negative. Your lab results and COVID test are pending. Your results will show in your MyChart. Any positive results will result in a phone call from a nurse with next steps in treatment and recommendations.    You are experiencing tension headaches.  We encourage conservative treatment with symptom relief. You can try OTC Excedrin Tension Headache relief as directed.  You can also continue to use Tylenol or Ibuprofen for your pain (Remember to use as directed do not exceed daily dosing recommendations). We  also encourage salt water gargles for your  sore throat. You should also consider throat lozenges and chloraseptic spray. Lemon and honey tea can also soothe your throat.       ED Prescriptions   None    PDMP not reviewed this encounter.   Dionisio David [place], [place] 11/25/22 1118

## 2022-11-26 ENCOUNTER — Ambulatory Visit
Admission: EM | Admit: 2022-11-26 | Discharge: 2022-11-26 | Disposition: A | Discharge status: Home / Self Care | Payer: Managed Care, Other (non HMO) | Attending: Emergency Medicine | Admitting: Emergency Medicine

## 2022-11-26 DIAGNOSIS — R21 Rash and other nonspecific skin eruption: Secondary | ICD-10-CM

## 2022-11-26 LAB — SARS CORONAVIRUS 2 (TAT 6-24 HRS): SARS Coronavirus 2: NEGATIVE

## 2022-11-26 MED ORDER — PREDNISONE 10 MG (21) PO TBPK: ORAL_TABLET | Freq: Every day | ORAL | 0 refills | Status: AC

## 2022-11-26 MED ORDER — AMOXICILLIN 500 MG PO CAPS: 500.0000 mg | ORAL_CAPSULE | Freq: Two times a day (BID) | ORAL | 0 refills | Status: AC

## 2022-11-26 NOTE — Discharge Instructions (Addendum)
Today you are being treated for 2 possible causes of your rash, appears almost as if there is a contact dermatitis meaning that your skin came in concerned with something that it does not like versus a scarlatina rash which is a red sandpaper rash related to strep infection  Even though your strep testing was 1-day ago we will prophylactically treat you with amoxicillin, take every morning and every evening for 7 days  Begin use of prednisone every morning with food as directed to reduce the inflammatory response  May continue use of Vaseline over the skin to help soothe and prevent dryness  You may follow-up with his urgent care as needed if symptoms persist or worsen

## 2022-11-26 NOTE — ED Triage Notes (Signed)
Pt reports he developed hives on his neck, back, face, and chest since yesterday afternoon. States the hives on his face are "pussing"  and swelling and they are making him feel like his eyes are going close on their own.   Pt denies any allergies to food or meds. He states he did not take anything out of the ordinary.

## 2022-11-26 NOTE — ED Provider Notes (Signed)
[facility]    CSN: [id number] Arrival date & time: 11/26/22  0900      History   Chief Complaint No chief complaint on file.   HPI Blake Walsh is a 32 y.o. male.   Patient presents for evaluation of erythematous hive-like rash present to the face, neck and chest beginning 1 day ago at approximately 2 PM.  She is mildly pruritic.  Unknown trigger, denies changes in toiletries, diet or recent travel.  Endorses 1 day ago he was having left-sided neck pain and swollen tonsils, was evaluated in this urgent care, strep testing negative.  Has claims rash with an unscented soapy water and applied Vaseline which has helped to soothe the skin.  Denies fevers, drainage, pain.  Past Medical History:  Diagnosis Date   Allergic rhinitis    Anxiety    Bradycardia 08/27/2016   Depression    Kidney stone    Ureteral calculus, left 07/14/2013    Patient Active Problem List   Diagnosis Date Noted   Current severe episode of major depressive disorder without psychotic features without prior episode ([place]) 01/10/2019   Suicidal ideations 01/10/2019   Marijuana smoker, continuous 01/10/2019   Sinus bradycardia 01/10/2019   Family history of colon cancer 01/10/2019   Cigarette nicotine dependence without complication [id number]   Bradycardia 08/27/2016   Ureteral calculus, left 07/14/2013   Microhematuria 06/19/2013   Encounter for preventative adult health care exam with abnormal findings 07/10/2011   Allergic conjunctivitis 01/29/2009   Allergic rhinitis 01/29/2009    Past Surgical History:  Procedure Laterality Date   NO PAST SURGERIES         Home Medications    Prior to Admission medications   Medication Sig Start Date End Date Taking? Authorizing Provider  amoxicillin (AMOXIL) 500 MG capsule Take 1 capsule (500 mg total) by mouth 2 (two) times daily for 7 days. 11/26/22 12/03/22 Yes Minaal Struckman R, NP  predniSONE (STERAPRED UNI-PAK 21 TAB) 10 MG (21) TBPK tablet  Take by mouth daily. Take 6 tabs by mouth daily  for 2 days, then 5 tabs for 2 days, then 4 tabs for 2 days, then 3 tabs for 2 days, 2 tabs for 2 days, then 1 tab by mouth daily for 2 days 11/26/22  Yes Hans Eden, NP    Family History Family History  Problem Relation Age of Onset   Hyperlipidemia Father    Hypertension Maternal Grandfather    Colon cancer Paternal Grandfather    Hypertension Paternal Grandfather    Alcohol abuse Paternal Uncle    Alcohol abuse Paternal Uncle     Social History Social History   Tobacco Use   Smoking status: Every Day    Packs/day: 0.50    Years: 9.00    Total pack years: 4.50    Types: Cigarettes    Start date: 11/13/2012   Smokeless tobacco: Never  Substance Use Topics   Alcohol use: Not Currently   Drug use: Yes    Types: Marijuana    Comment: 1g/day 01/10/19, Now takes "2 hits a day' - 01/30/19     Allergies   Patient has no known allergies.   Review of Systems Review of Systems  Constitutional: Negative.   HENT: Negative.    Respiratory: Negative.    Cardiovascular: Negative.   Skin:  Positive for rash. Negative for color change, pallor and wound.     Physical Exam Triage Vital Signs ED Triage Vitals  Enc Vitals  Group     BP 11/26/22 0925 110/74     Pulse Rate 11/26/22 0925 61     Resp 11/26/22 0925 20     Temp 11/26/22 0925 98.1 F (36.7 C)     Temp Source 11/26/22 0925 Oral     SpO2 11/26/22 0925 96 %     Weight --      Height --      Head Circumference --      Peak Flow --      Pain Score 11/26/22 0928 0     Pain Loc --      Pain Edu? --      Excl. in [place]? --    No data found.  Updated Vital Signs BP 110/74 (BP Location: Left Arm)   Pulse 61   Temp 98.1 F (36.7 C) (Oral)   Resp 20   SpO2 96%   Visual Acuity Right Eye Distance:   Left Eye Distance:   Bilateral Distance:    Right Eye Near:   Left Eye Near:    Bilateral Near:     Physical Exam Constitutional:      Appearance: Normal  appearance.  HENT:     Head: Normocephalic.  Eyes:     Extraocular Movements: Extraocular movements intact.  Pulmonary:     Effort: Pulmonary effort is normal.  Skin:    Comments: Defer to photo   Neurological:     Mental Status: He is alert and oriented to person, place, and time. Mental status is at baseline.         UC Treatments / Results  Labs (all labs ordered are listed, but only abnormal results are displayed) Labs Reviewed - No data to display  EKG   Radiology No results found.  Procedures Procedures (including critical care time)  Medications Ordered in UC Medications - No data to display  Initial Impression / Assessment and Plan / UC Course  I have reviewed the triage vital signs and the nursing notes.  Pertinent labs & imaging results that were available during my care of the patient were reviewed by me and considered in my medical decision making (see chart for details).  Rash  Vital signs are stable, pharynx is clear without obstruction, denies shortness of breath or difficulty swallowing, unknown cause, strep testing 1 day ago negative however due to presentation of the neck, somewhat consistent with a sandpaper rash therefore will provide coverage for bacteria as throat but was not cultured, amoxicillin prescribed, additionally area to the face appears to be similar in presentation to a contact dermatitis with an unknown trigger therefore prednisone taper prescribed for additional management and support, may continue use cleansing with unscented diluted soapy water as well as applying Vaseline to prevent dryness and irritation, given precautions to follow-up with his urgent care as needed if symptoms persist or worsen Final Clinical Impressions(s) / UC Diagnoses   Final diagnoses:  Rash and nonspecific skin eruption     Discharge Instructions      Today you are being treated for 2 possible causes of your rash, appears almost as if there is a  contact dermatitis meaning that your skin came in concerned with something that it does not like versus a scarlatina rash which is a red sandpaper rash related to strep infection  Even though your strep testing was 1-day ago we will prophylactically treat you with amoxicillin, take every morning and every evening for 7 days  Begin use of prednisone every morning  with food as directed to reduce the inflammatory response  May continue use of Vaseline over the skin to help soothe and prevent dryness  You may follow-up with his urgent care as needed if symptoms persist or worsen     ED Prescriptions     Medication Sig Dispense Auth. Provider   amoxicillin (AMOXIL) 500 MG capsule Take 1 capsule (500 mg total) by mouth 2 (two) times daily for 7 days. 14 capsule Gailya Tauer R, NP   predniSONE (STERAPRED UNI-PAK 21 TAB) 10 MG (21) TBPK tablet Take by mouth daily. Take 6 tabs by mouth daily  for 2 days, then 5 tabs for 2 days, then 4 tabs for 2 days, then 3 tabs for 2 days, 2 tabs for 2 days, then 1 tab by mouth daily for 2 days 42 tablet Ciel Yanes, Leitha Schuller, NP      PDMP not reviewed this encounter.   Hans Eden, NP 11/26/22 1137

## 2022-11-29 ENCOUNTER — Other Ambulatory Visit: Payer: Self-pay

## 2022-11-29 ENCOUNTER — Encounter: Payer: Self-pay | Admitting: Emergency Medicine

## 2022-11-29 ENCOUNTER — Ambulatory Visit
Admission: EM | Admit: 2022-11-29 | Discharge: 2022-11-29 | Disposition: A | Discharge status: Home / Self Care | Payer: PRIVATE HEALTH INSURANCE | Attending: Physician Assistant | Admitting: Physician Assistant

## 2022-11-29 DIAGNOSIS — L309 Dermatitis, unspecified: Secondary | ICD-10-CM

## 2022-11-29 MED ORDER — FAMOTIDINE 20 MG PO TABS: 20.0000 mg | ORAL_TABLET | Freq: Two times a day (BID) | ORAL | 0 refills | Status: AC

## 2022-11-29 MED ORDER — LORATADINE 10 MG PO TABS: 10.0000 mg | ORAL_TABLET | Freq: Every day | ORAL | 0 refills | Status: AC

## 2022-11-29 NOTE — ED Provider Notes (Signed)
[facility]    CSN: [id number] Arrival date & time: 11/29/22  [id number]      History   Chief Complaint Chief Complaint  Patient presents with   Facial Swelling    HPI Blake Walsh is a 32 y.o. male.   32 year old male presents with facial swelling.  Patient indicates he returns today as he continues to have a localized facial swelling mainly around the forehead cheeks and periorbital areas.  He indicates that he does not have any swelling on any other part of the body.  He indicates that the rash feels warm and tight, he indicates it does not itch.  He indicates he has been taking the [employer] for the past several days with minimal improvement of his symptoms.  He indicates the rash did start as a pustular eruption and he was placed on the Sterapred Dosepak along with amoxicillin.  He indicates the pustular recurrence has resolved but he continues to have the rash that is localized along with mild swelling underneath the eyes.  He indicates he is not having any fever, chills, sore throat, cough or congestion.  Patient indicates he has not changed the foods he eats, liquids he drinks, he is not exposed to any type of new airborne chemicals or other types of chemicals at work or at home.  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  He has not applied any type of creams or other agents to the face.     Past Medical History:  Diagnosis Date   Allergic rhinitis    Anxiety    Bradycardia 08/27/2016   Depression    Kidney stone    Ureteral calculus, left 07/14/2013    Patient Active Problem List   Diagnosis Date Noted   Current severe episode of major depressive disorder without psychotic features without prior episode ([place]) 01/10/2019   Suicidal ideations 01/10/2019   Marijuana smoker, continuous 01/10/2019   Sinus bradycardia 01/10/2019   Family history of colon cancer 01/10/2019   Cigarette nicotine  dependence without complication Q000111Q   Bradycardia 08/27/2016   Ureteral calculus, left 07/14/2013   Microhematuria 06/19/2013   Encounter for preventative adult health care exam with abnormal findings 07/10/2011   Allergic conjunctivitis 01/29/2009   Allergic rhinitis 01/29/2009    Past Surgical History:  Procedure Laterality Date   NO PAST SURGERIES         Home Medications    Prior to Admission medications   Medication Sig Start Date End Date Taking? Authorizing Provider  famotidine (PEPCID) 20 MG tablet Take 1 tablet (20 mg total) by mouth 2 (two) times daily. 11/29/22  Yes Nyoka Lint, PA-C  loratadine (CLARITIN) 10 MG tablet Take 1 tablet (10 mg total) by mouth daily. 11/29/22  Yes Nyoka Lint, PA-C  amoxicillin (AMOXIL) 500 MG capsule Take 1 capsule (500 mg total) by mouth 2 (two) times daily for 7 days. 11/26/22 12/03/22  Hans Eden, NP  predniSONE (STERAPRED UNI-PAK 21 TAB) 10 MG (21) TBPK tablet Take by mouth daily. Take 6 tabs by mouth daily  for 2 days, then 5 tabs for 2 days, then 4 tabs for 2 days, then 3 tabs for 2 days, 2 tabs for 2 days, then 1 tab by mouth daily for 2 days 11/26/22   Hans Eden, NP    Family History Family History  Problem Relation Age of Onset   Hyperlipidemia Father  Hypertension Maternal Grandfather    Colon cancer Paternal Grandfather    Hypertension Paternal Grandfather    Alcohol abuse Paternal Uncle    Alcohol abuse Paternal Uncle     Social History Social History   Tobacco Use   Smoking status: Every Day    Packs/day: 0.50    Years: 9.00    Total pack years: 4.50    Types: Cigarettes    Start date: 11/13/2012   Smokeless tobacco: Never  Substance Use Topics   Alcohol use: Not Currently   Drug use: Yes    Types: Marijuana    Comment: 1g/day 01/10/19, Now takes "2 hits a day' - 01/30/19     Allergies   Patient has no known allergies.   Review of Systems Review of Systems  Skin:  Positive for rash (face  and forehead).     Physical Exam Triage Vital Signs ED Triage Vitals [11/29/22 0948]  Enc Vitals Group     BP (!) 146/78     Pulse Rate 79     Resp 18     Temp 97.9 F (36.6 C)     Temp Source Oral     SpO2 97 %     Weight      Height      Head Circumference      Peak Flow      Pain Score 3     Pain Loc      Pain Edu?      Excl. in [place]?    No data found.  Updated Vital Signs BP (!) 146/78 (BP Location: Left Arm)   Pulse 79   Temp 97.9 F (36.6 C) (Oral)   Resp 18   SpO2 97%   Visual Acuity Right Eye Distance:   Left Eye Distance:   Bilateral Distance:    Right Eye Near:   Left Eye Near:    Bilateral Near:       Physical Exam Constitutional:      Appearance: Normal appearance.  HENT:     Right Ear: Tympanic membrane and ear canal normal.     Left Ear: Tympanic membrane and ear canal normal.     Mouth/Throat:     Mouth: Mucous membranes are moist.     Pharynx: Oropharynx is clear. Uvula midline.     Comments: Face: (Refer to picture) the forehead, face and infraorbital areas with a mild erythematous type rash that is mainly confined to this area, there is mild flaking of the eyebrows bilaterally.  There is no evidence of drainage, or infectious process as the rash is confined to mainly facial exposed areas and is limited to the frontal band of the hat that the patient is wearing with the top of the forehead being clear. Neurological:     Mental Status: He is alert.      UC Treatments / Results  Labs (all labs ordered are listed, but only abnormal results are displayed) Labs Reviewed - No data to display  EKG   Radiology No results found.  Procedures Procedures (including critical care time)  Medications Ordered in UC Medications - No data to display  Initial Impression / Assessment and Plan / UC Course  I have reviewed the triage vital signs and the nursing notes.  Pertinent labs & imaging results that were available during my care of the  patient were reviewed by me and considered in my medical decision making (see chart for details).    Plan: The diagnosis will  be treated with the following: 1.  Facial dermatitis: A.  Advised to continue this Sterapred Dosepak as directed. B.  Add Claritin 10 mg once daily to help control symptoms. C.  Add Pepcid 20 mg twice daily to help control symptoms. D.  Patient has been referred internally [facility] for evaluation and treatment modification. 2.  Advised follow-up PCP return to urgent care as needed. Final Clinical Impressions(s) / UC Diagnoses   Final diagnoses:  Facial dermatitis     Discharge Instructions      Advised to continue to take the Sterapred Dosepak as directed. Advised to add Claritin 10 mg once daily until completed. Advised to add Pepcid 20 mg twice daily until completed. Advised to use either Vaseline or Eucerin ointment and apply lightly to the eyebrow areas to decrease dryness and irritation.  Internal referral has been made to: [facility], their office should be calling you within the next 48 hours to arrange an appointment for evaluation.  Advised follow-up PCP or return to urgent care as needed.    ED Prescriptions     Medication Sig Dispense Auth. Provider   loratadine (CLARITIN) 10 MG tablet Take 1 tablet (10 mg total) by mouth daily. 15 tablet Nyoka Lint, PA-C   famotidine (PEPCID) 20 MG tablet Take 1 tablet (20 mg total) by mouth 2 (two) times daily. 30 tablet Nyoka Lint, PA-C      PDMP not reviewed this encounter.   Nyoka Lint, PA-C 11/29/22 1017

## 2022-11-29 NOTE — ED Triage Notes (Signed)
Pt here for continued facial swelling; pt is currently taking amoxicillin and prednisone

## 2022-11-29 NOTE — Discharge Instructions (Signed)
Advised to continue to take the Sterapred Dosepak as directed. Advised to add Claritin 10 mg once daily until completed. Advised to add Pepcid 20 mg twice daily until completed. Advised to use either Vaseline or Eucerin ointment and apply lightly to the eyebrow areas to decrease dryness and irritation.  Internal referral has been made to: [facility], their office should be calling you within the next 48 hours to arrange an appointment for evaluation.  Advised follow-up PCP or return to urgent care as needed.

## 2022-12-09 ENCOUNTER — Emergency Department (HOSPITAL_COMMUNITY)
Admission: EM | Admit: 2022-12-09 | Discharge: 2022-12-09 | Disposition: A | Discharge status: Home / Self Care | Payer: PRIVATE HEALTH INSURANCE | Source: Home / Self Care
